# Patient Record
Sex: Male | Born: 1980 | Race: Black or African American | Hispanic: No | Marital: Single | Smoking: Current every day smoker
Health system: Southern US, Community
[De-identification: ages and names within clinical notes are randomized; demographics above are authoritative.]

## PROBLEM LIST (undated history)

## (undated) DIAGNOSIS — J45909 Unspecified asthma, uncomplicated: Secondary | ICD-10-CM

## (undated) DIAGNOSIS — Z9981 Dependence on supplemental oxygen: Secondary | ICD-10-CM

## (undated) DIAGNOSIS — J189 Pneumonia, unspecified organism: Secondary | ICD-10-CM

## (undated) DIAGNOSIS — R0602 Shortness of breath: Secondary | ICD-10-CM

## (undated) HISTORY — PX: NO PAST SURGERIES: SHX2092

---

## 2013-04-22 ENCOUNTER — Emergency Department (INDEPENDENT_AMBULATORY_CARE_PROVIDER_SITE_OTHER)
Admission: EM | Admit: 2013-04-22 | Discharge: 2013-04-22 | Disposition: A | Discharge status: Home / Self Care | Payer: Self-pay | Source: Home / Self Care | Attending: Family Medicine | Admitting: Family Medicine

## 2013-04-22 ENCOUNTER — Encounter (HOSPITAL_COMMUNITY): Payer: Self-pay

## 2013-04-22 DIAGNOSIS — L309 Dermatitis, unspecified: Secondary | ICD-10-CM

## 2013-04-22 DIAGNOSIS — L259 Unspecified contact dermatitis, unspecified cause: Secondary | ICD-10-CM

## 2013-04-22 MED ORDER — METHYLPREDNISOLONE ACETATE 80 MG/ML IJ SUSP
INTRAMUSCULAR | Status: AC
  Filled 2013-04-22: qty 1

## 2013-04-22 MED ORDER — METHYLPREDNISOLONE ACETATE 40 MG/ML IJ SUSP
80.0000 mg | Freq: Once | INTRAMUSCULAR | Status: AC
  Administered 2013-04-22: 80 mg via INTRAMUSCULAR

## 2013-04-22 MED ORDER — TRIAMCINOLONE ACETONIDE 40 MG/ML IJ SUSP
INTRAMUSCULAR | Status: AC
  Filled 2013-04-22: qty 5

## 2013-04-22 MED ORDER — TRIAMCINOLONE ACETONIDE 40 MG/ML IJ SUSP
40.0000 mg | Freq: Once | INTRAMUSCULAR | Status: AC
  Administered 2013-04-22: 40 mg via INTRAMUSCULAR

## 2013-04-22 MED ORDER — FLUTICASONE PROPIONATE 0.05 % EX CREA: TOPICAL_CREAM | Freq: Two times a day (BID) | CUTANEOUS | Status: DC

## 2013-04-22 NOTE — ED Provider Notes (Signed)
History     CSN: [id number]  Arrival date & time 04/22/13  1457   First MD Initiated Contact with Patient 04/22/13 1621      Chief Complaint  Patient presents with  . Rash    (Consider location/radiation/quality/duration/timing/severity/associated sxs/prior treatment) Patient is a 32 y.o. male presenting with rash. The history is provided by the patient and a friend.  Rash Duration:  4 weeks Progression:  Worsening Chronicity:  New Context comment:  No other person with rash, no h/o skin rashes. Ineffective treatments: alcohol not helping. Associated symptoms comment:  Itching   History reviewed. No pertinent past medical history.  History reviewed. No pertinent past surgical history.  History reviewed. No pertinent family history.  History  Substance Use Topics  . Smoking status: Not on file  . Smokeless tobacco: Not on file  . Alcohol Use: Not on file      Review of Systems  Constitutional: Negative.   Musculoskeletal: Negative.   Skin: Positive for rash.    Allergies  Review of patient's allergies indicates no known allergies.  Home Medications   Current Outpatient Rx  Name  Route  Sig  Dispense  Refill  . fluticasone (CUTIVATE) 0.05 % cream   Topical   Apply topically 2 (two) times daily.   60 g   0     BP 107/63  Pulse 60  Temp(Src) 98.2 F (36.8 C) (Oral)  Resp 18  SpO2 100%  Physical Exam  Nursing note and vitals reviewed. Constitutional: He is oriented to person, place, and time. He appears well-developed and well-nourished.  Neurological: He is alert and oriented to person, place, and time.  Skin: Skin is warm and dry. Rash noted.  Patchy irreg, papulovesicular rash on neck, right lower abd, not on arms or legs.    ED Course  Procedures (including critical care time)  Labs Reviewed - No data to display No results found.   1. Acute eczema       MDM         Linna Hoff, MD 04/22/13 2029

## 2013-04-22 NOTE — ED Notes (Signed)
Patient not in waiting room  

## 2013-04-22 NOTE — ED Notes (Signed)
Reports 1 month duration of rash , generalized, getting worse; NAD

## 2013-07-08 DIAGNOSIS — J189 Pneumonia, unspecified organism: Secondary | ICD-10-CM

## 2013-07-08 HISTORY — DX: Pneumonia, unspecified organism: J18.9

## 2013-07-21 ENCOUNTER — Emergency Department (HOSPITAL_COMMUNITY)
Admission: EM | Admit: 2013-07-21 | Discharge: 2013-07-22 | Disposition: A | Discharge status: Home / Self Care | Payer: Self-pay | Attending: Emergency Medicine | Admitting: Emergency Medicine

## 2013-07-21 ENCOUNTER — Encounter (HOSPITAL_COMMUNITY): Payer: Self-pay | Admitting: Emergency Medicine

## 2013-07-21 DIAGNOSIS — F17200 Nicotine dependence, unspecified, uncomplicated: Secondary | ICD-10-CM | POA: Insufficient documentation

## 2013-07-21 DIAGNOSIS — G8929 Other chronic pain: Secondary | ICD-10-CM | POA: Insufficient documentation

## 2013-07-21 DIAGNOSIS — J45909 Unspecified asthma, uncomplicated: Secondary | ICD-10-CM | POA: Insufficient documentation

## 2013-07-21 DIAGNOSIS — IMO0002 Reserved for concepts with insufficient information to code with codable children: Secondary | ICD-10-CM | POA: Insufficient documentation

## 2013-07-21 DIAGNOSIS — Z87828 Personal history of other (healed) physical injury and trauma: Secondary | ICD-10-CM | POA: Insufficient documentation

## 2013-07-21 DIAGNOSIS — M5430 Sciatica, unspecified side: Secondary | ICD-10-CM | POA: Insufficient documentation

## 2013-07-21 DIAGNOSIS — Z720 Tobacco use: Secondary | ICD-10-CM

## 2013-07-21 DIAGNOSIS — M5431 Sciatica, right side: Secondary | ICD-10-CM

## 2013-07-21 HISTORY — DX: Unspecified asthma, uncomplicated: J45.909
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
## 2013-07-22 MED ORDER — IBUPROFEN 600 MG PO TABS: 600.0000 mg | ORAL_TABLET | Freq: Four times a day (QID) | ORAL | Status: DC | PRN

## 2013-07-22 MED ORDER — TRAMADOL HCL 50 MG PO TABS: 50.0000 mg | ORAL_TABLET | Freq: Four times a day (QID) | ORAL | Status: DC | PRN

## 2013-07-22 MED ORDER — CYCLOBENZAPRINE HCL 10 MG PO TABS: 10.0000 mg | ORAL_TABLET | Freq: Two times a day (BID) | ORAL | Status: DC | PRN

## 2013-07-22 MED ORDER — OXYCODONE-ACETAMINOPHEN 5-325 MG PO TABS
2.0000 | ORAL_TABLET | Freq: Once | ORAL | Status: AC
  Administered 2013-07-22: 2 via ORAL
  Filled 2013-07-22: qty 2

## 2013-07-22 MED ORDER — IBUPROFEN 800 MG PO TABS
800.0000 mg | ORAL_TABLET | Freq: Once | ORAL | Status: AC
  Administered 2013-07-22: 800 mg via ORAL
  Filled 2013-07-22: qty 1
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
## 2013-07-25 ENCOUNTER — Encounter (HOSPITAL_COMMUNITY): Payer: Self-pay | Admitting: *Deleted

## 2013-07-25 ENCOUNTER — Inpatient Hospital Stay (HOSPITAL_COMMUNITY)
Admission: EM | Admit: 2013-07-25 | Discharge: 2013-07-31 | DRG: 193 | Disposition: A | Discharge status: Home / Self Care | Payer: Self-pay | Attending: Internal Medicine | Admitting: Internal Medicine

## 2013-07-25 ENCOUNTER — Emergency Department (HOSPITAL_COMMUNITY): Payer: Self-pay

## 2013-07-25 DIAGNOSIS — J441 Chronic obstructive pulmonary disease with (acute) exacerbation: Secondary | ICD-10-CM | POA: Diagnosis present

## 2013-07-25 DIAGNOSIS — F17200 Nicotine dependence, unspecified, uncomplicated: Secondary | ICD-10-CM | POA: Diagnosis present

## 2013-07-25 DIAGNOSIS — J189 Pneumonia, unspecified organism: Principal | ICD-10-CM | POA: Diagnosis present

## 2013-07-25 DIAGNOSIS — J9601 Acute respiratory failure with hypoxia: Secondary | ICD-10-CM | POA: Diagnosis present

## 2013-07-25 DIAGNOSIS — J9600 Acute respiratory failure, unspecified whether with hypoxia or hypercapnia: Secondary | ICD-10-CM | POA: Diagnosis present

## 2013-07-25 DIAGNOSIS — Z720 Tobacco use: Secondary | ICD-10-CM | POA: Diagnosis present

## 2013-07-25 DIAGNOSIS — M5430 Sciatica, unspecified side: Secondary | ICD-10-CM | POA: Diagnosis present

## 2013-07-25 DIAGNOSIS — J45901 Unspecified asthma with (acute) exacerbation: Secondary | ICD-10-CM | POA: Diagnosis present

## 2013-07-25 LAB — CBC WITH DIFFERENTIAL/PLATELET
Eosinophils Absolute: 0 10*3/uL (ref 0.0–0.7)
Eosinophils Relative: 0 % (ref 0–5)
Hemoglobin: 14.1 g/dL (ref 13.0–17.0)
Lymphocytes Relative: 6 % — ABNORMAL LOW (ref 12–46)
Lymphs Abs: 0.8 10*3/uL (ref 0.7–4.0)
MCH: 30.9 pg (ref 26.0–34.0)
MCV: 87.1 fL (ref 78.0–100.0)
Monocytes Relative: 5 % (ref 3–12)
RBC: 4.56 MIL/uL (ref 4.22–5.81)
WBC: 13.4 10*3/uL — ABNORMAL HIGH (ref 4.0–10.5)

## 2013-07-25 LAB — BASIC METABOLIC PANEL
BUN: 13 mg/dL (ref 6–23)
CO2: 25 mEq/L (ref 19–32)
GFR calc non Af Amer: >90 mL/min (ref >90)
Glucose, Bld: 145 mg/dL — ABNORMAL HIGH (ref 70–99)
Potassium: 4.3 mEq/L (ref 3.5–5.1)
Sodium: 138 mEq/L (ref 135–145)

## 2013-07-25 LAB — MRSA PCR SCREENING: MRSA by PCR: NEGATIVE

## 2013-07-25 MED ORDER — ENOXAPARIN SODIUM 40 MG/0.4ML ~~LOC~~ SOLN
40.0000 mg | SUBCUTANEOUS | Status: DC
  Administered 2013-07-25 – 2013-07-30 (×6): 40 mg via SUBCUTANEOUS
  Filled 2013-07-25 (×7): qty 0.4

## 2013-07-25 MED ORDER — PNEUMOCOCCAL VAC POLYVALENT 25 MCG/0.5ML IJ INJ
0.5000 mL | INJECTION | INTRAMUSCULAR | Status: AC
  Administered 2013-07-26: 0.5 mL via INTRAMUSCULAR
  Filled 2013-07-25: qty 0.5

## 2013-07-25 MED ORDER — ALBUTEROL SULFATE (5 MG/ML) 0.5% IN NEBU
2.5000 mg | INHALATION_SOLUTION | Freq: Four times a day (QID) | RESPIRATORY_TRACT | Status: DC
  Administered 2013-07-25 – 2013-07-26 (×3): 2.5 mg via RESPIRATORY_TRACT
  Filled 2013-07-25 (×3): qty 0.5

## 2013-07-25 MED ORDER — CEFTRIAXONE SODIUM 1 G IJ SOLR
1.0000 g | Freq: Once | INTRAMUSCULAR | Status: AC
  Administered 2013-07-25: 1 g via INTRAMUSCULAR
  Filled 2013-07-25: qty 10

## 2013-07-25 MED ORDER — ONDANSETRON HCL 4 MG/2ML IJ SOLN: 4.0000 mg | Freq: Three times a day (TID) | INTRAMUSCULAR | Status: DC | PRN

## 2013-07-25 MED ORDER — ALBUTEROL (5 MG/ML) CONTINUOUS INHALATION SOLN
10.0000 mg/h | INHALATION_SOLUTION | Freq: Once | RESPIRATORY_TRACT | Status: AC
Start: 2013-07-25 — End: 2013-07-25

## 2013-07-25 MED ORDER — ALBUTEROL SULFATE (5 MG/ML) 0.5% IN NEBU: 2.5000 mg | INHALATION_SOLUTION | RESPIRATORY_TRACT | Status: DC

## 2013-07-25 MED ORDER — DEXTROSE 5 % IV SOLN
500.0000 mg | INTRAVENOUS | Status: DC
  Filled 2013-07-25: qty 500

## 2013-07-25 MED ORDER — PANTOPRAZOLE SODIUM 40 MG PO TBEC
40.0000 mg | DELAYED_RELEASE_TABLET | Freq: Every day | ORAL | Status: DC
  Administered 2013-07-26 – 2013-07-28 (×3): 40 mg via ORAL
  Filled 2013-07-25 (×4): qty 1

## 2013-07-25 MED ORDER — DEXTROSE 5 % IV SOLN
1.0000 g | INTRAVENOUS | Status: DC
  Administered 2013-07-26 – 2013-07-27 (×2): 1 g via INTRAVENOUS
  Filled 2013-07-25 (×3): qty 10

## 2013-07-25 MED ORDER — ACETAMINOPHEN 325 MG PO TABS
650.0000 mg | ORAL_TABLET | Freq: Four times a day (QID) | ORAL | Status: DC | PRN
  Administered 2013-07-29: 650 mg via ORAL
  Filled 2013-07-25: qty 2

## 2013-07-25 MED ORDER — ALBUTEROL SULFATE (5 MG/ML) 0.5% IN NEBU
2.5000 mg | INHALATION_SOLUTION | RESPIRATORY_TRACT | Status: DC | PRN
  Administered 2013-07-27 – 2013-07-29 (×3): 2.5 mg via RESPIRATORY_TRACT
  Filled 2013-07-25 (×4): qty 0.5

## 2013-07-25 MED ORDER — LIDOCAINE HCL (PF) 1 % IJ SOLN
INTRAMUSCULAR | Status: AC
  Administered 2013-07-25: 2 mL
  Filled 2013-07-25: qty 5

## 2013-07-25 MED ORDER — ONDANSETRON HCL 4 MG/2ML IJ SOLN: 4.0000 mg | Freq: Four times a day (QID) | INTRAMUSCULAR | Status: DC | PRN

## 2013-07-25 MED ORDER — SODIUM CHLORIDE 0.9 % IV SOLN
INTRAVENOUS | Status: DC
  Administered 2013-07-25 – 2013-07-28 (×5): via INTRAVENOUS

## 2013-07-25 MED ORDER — ALBUTEROL (5 MG/ML) CONTINUOUS INHALATION SOLN
INHALATION_SOLUTION | RESPIRATORY_TRACT | Status: AC
  Administered 2013-07-25: 10 mg/h via RESPIRATORY_TRACT
  Filled 2013-07-25: qty 20

## 2013-07-25 MED ORDER — TRAMADOL HCL 50 MG PO TABS
50.0000 mg | ORAL_TABLET | Freq: Four times a day (QID) | ORAL | Status: DC | PRN
  Administered 2013-07-25 – 2013-07-30 (×6): 50 mg via ORAL
  Filled 2013-07-25 (×6): qty 1

## 2013-07-25 MED ORDER — METHYLPREDNISOLONE SODIUM SUCC 125 MG IJ SOLR
60.0000 mg | Freq: Three times a day (TID) | INTRAMUSCULAR | Status: DC
  Administered 2013-07-25 – 2013-07-28 (×7): 60 mg via INTRAVENOUS
  Filled 2013-07-25 (×11): qty 0.96

## 2013-07-25 MED ORDER — INFLUENZA VAC SPLIT QUAD 0.5 ML IM SUSP
0.5000 mL | INTRAMUSCULAR | Status: AC
  Administered 2013-07-26: 0.5 mL via INTRAMUSCULAR
  Filled 2013-07-25: qty 0.5

## 2013-07-25 MED ORDER — DEXAMETHASONE SODIUM PHOSPHATE 10 MG/ML IJ SOLN
10.0000 mg | Freq: Once | INTRAMUSCULAR | Status: AC
  Administered 2013-07-25: 10 mg via INTRAVENOUS
  Filled 2013-07-25: qty 1

## 2013-07-25 MED ORDER — DEXTROSE 5 % IV SOLN
500.0000 mg | Freq: Once | INTRAVENOUS | Status: DC
  Administered 2013-07-25: 500 mg via INTRAVENOUS
  Filled 2013-07-25: qty 500

## 2013-07-25 MED ORDER — ALUM & MAG HYDROXIDE-SIMETH 200-200-20 MG/5ML PO SUSP: 15.0000 mL | ORAL | Status: DC | PRN

## 2013-07-25 MED ORDER — CYCLOBENZAPRINE HCL 10 MG PO TABS
10.0000 mg | ORAL_TABLET | Freq: Two times a day (BID) | ORAL | Status: DC | PRN
  Administered 2013-07-26 – 2013-07-28 (×3): 10 mg via ORAL
  Filled 2013-07-25 (×3): qty 1

## 2013-07-25 MED ORDER — SODIUM CHLORIDE 0.9 % IV BOLUS (SEPSIS)
1000.0000 mL | Freq: Once | INTRAVENOUS | Status: AC
  Administered 2013-07-25: 1000 mL via INTRAVENOUS

## 2013-07-25 MED ORDER — IPRATROPIUM BROMIDE 0.02 % IN SOLN
0.5000 mg | Freq: Four times a day (QID) | RESPIRATORY_TRACT | Status: DC
  Administered 2013-07-25 – 2013-07-26 (×3): 0.5 mg via RESPIRATORY_TRACT
  Filled 2013-07-25 (×3): qty 2.5
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
## 2013-07-25 NOTE — ED Notes (Signed)
MD aware of patient O2 level on 2L via [place] of 88%, O2 increased to 4L, will continue to monitor
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
## 2013-07-25 NOTE — ED Notes (Signed)
Pt reports having asthma, increase in sob yesterday, no relief with inhalers. spo2 90%, HR 130 at triage.

## 2013-07-25 NOTE — ED Provider Notes (Signed)
CSN: [id number]     Arrival date & time 07/25/13  1623 History   None    Chief Complaint  Patient presents with  . Asthma  . Shortness of Breath   (Consider location/radiation/quality/duration/timing/severity/associated sxs/prior Treatment) Patient is a 32 y.o. male presenting with shortness of breath. The history is provided by the patient. No language interpreter was used.  Shortness of Breath Severity:  Severe Onset quality:  Sudden Duration:  1 day Timing:  Constant Progression:  Worsening Chronicity:  New Context: URI   Relieved by:  Nothing Worsened by:  Exertion and activity Ineffective treatments:  Inhaler Associated symptoms: cough, fever (subjective) and sore throat   Associated symptoms: no abdominal pain, no chest pain, no headaches, no rash and no vomiting   Risk factors comment:  Asthma   Past Medical History  Diagnosis Date  . Asthma    History reviewed. No pertinent past surgical history. History reviewed. No pertinent family history. History  Substance Use Topics  . Smoking status: Never Smoker   . Smokeless tobacco: Not on file  . Alcohol Use: Yes     Comment: occ    Review of Systems  Constitutional: Positive for fever (subjective).  HENT: Positive for congestion, sore throat and rhinorrhea.   Respiratory: Positive for cough and shortness of breath.   Cardiovascular: Negative for chest pain.  Gastrointestinal: Negative for nausea, vomiting, abdominal pain and diarrhea.  Genitourinary: Negative for dysuria and hematuria.  Skin: Negative for rash.  Neurological: Negative for syncope, light-headedness and headaches.  All other systems reviewed and are negative.    Allergies  Review of patient's allergies indicates no known allergies.  Home Medications   Current Outpatient Rx  Name  Route  Sig  Dispense  Refill  . albuterol (PROVENTIL HFA;VENTOLIN HFA) 108 (90 BASE) MCG/ACT inhaler   Inhalation   Inhale 2 puffs into the lungs every 6 (six)  hours as needed for wheezing.         . cyclobenzaprine (FLEXERIL) 10 MG tablet   Oral   Take 1 tablet (10 mg total) by mouth 2 (two) times daily as needed for muscle spasms.   20 tablet   0   . ibuprofen (ADVIL,MOTRIN) 600 MG tablet   Oral   Take 1 tablet (600 mg total) by mouth every 6 (six) hours as needed for pain.   30 tablet   0   . traMADol (ULTRAM) 50 MG tablet   Oral   Take 1 tablet (50 mg total) by mouth every 6 (six) hours as needed for pain.   15 tablet   0    BP 106/76  Pulse 134  Temp(Src) 98.9 F (37.2 C) (Oral)  Resp 26  SpO2 92% Physical Exam  Nursing note and vitals reviewed. Constitutional: He is oriented to person, place, and time. He appears well-developed and well-nourished. He appears distressed (mild).  HENT:  Head: Normocephalic and atraumatic.  Right Ear: External ear normal.  Left Ear: External ear normal.  Eyes: EOM are normal.  Neck: Normal range of motion. Neck supple.  Cardiovascular: Normal rate, regular rhythm, normal heart sounds and intact distal pulses.  Exam reveals no gallop and no friction rub.   No murmur heard. Pulmonary/Chest: No respiratory distress. He has wheezes. He has no rales. He exhibits no tenderness.  tachypnea  Abdominal: Soft. Bowel sounds are normal. He exhibits no distension. There is no tenderness. There is no rebound.  Musculoskeletal: Normal range of motion. He exhibits no edema and  no tenderness.  Lymphadenopathy:    He has no cervical adenopathy.  Neurological: He is alert and oriented to person, place, and time.  Skin: Skin is warm. No rash noted.  Psychiatric: He has a normal mood and affect. His behavior is normal.    ED Course  Procedures (including critical care time) Labs Review Labs Reviewed  CBC WITH DIFFERENTIAL - Abnormal; Notable for the following:    WBC 13.4 (*)    Neutrophils Relative % 89 (*)    Neutro Abs 12.0 (*)    Lymphocytes Relative 6 (*)    All other components within normal  limits  BASIC METABOLIC PANEL - Abnormal; Notable for the following:    Glucose, Bld 145 (*)    All other components within normal limits  MRSA PCR SCREENING  CULTURE, BLOOD (ROUTINE X 2)  CULTURE, BLOOD (ROUTINE X 2)  CULTURE, EXPECTORATED SPUTUM-ASSESSMENT  GRAM STAIN  LEGIONELLA ANTIGEN, URINE  STREP PNEUMONIAE URINARY ANTIGEN  HIV ANTIBODY (ROUTINE TESTING)   Imaging Review Dg Chest Portable 1 View  07/25/2013   CLINICAL DATA:  Chest pain and shortness of breast.  EXAM: PORTABLE CHEST - 1 VIEW  COMPARISON:  None.  FINDINGS: Single view of the chest was obtained. There are patchy densities in the mid and lower lungs bilaterally. There is a more confluent opacity at the left lung base. Heart and mediastinum are within normal limits. The trachea is midline.  IMPRESSION: Patchy densities in the lower lungs, left side greater than right. Findings are concerning for multi focal pneumonia.   Electronically Signed   By: Richarda Overlie M.D.   On: 07/25/2013 18:04    MDM   1. Acute respiratory failure   2. Asthma exacerbation   3. PNA (pneumonia)   4. Tobacco abuse    4:34 PM Pt is a 32 y.o. male with pertinent PMHX of asthma who presents to the ED with shortness of breath. Pt presented with shortness of breath since yesterday. Endorses subjective fevers, cough, congestion, runny nose. No sick contacts. Denies chest pain. Worse with exertion, relieved by nothing. No relief with inhalers. Has been out of inhalers for several years. Uses inhaler daily. Has been using his girlfriend's inhaler. Not on steroids. Continues to smoke  History of asthma: history of need for ICU admission, no history of need for intubation for asthma.  On exam: AF, tachypnea, mild distress. Wheezes. Plan for continuous albuterol, decadron IV, CXR given history of URI, chronic lung disease, history of smoking to rule out pnemonia  CXR PA/LAT for shortness of breath showed concern for pneumonia in the lower lungs  left>right  Review of labs: BMP showed no electrolyte abnormalities. CBC showed leukocytosis, H&H 14.1/39.7.   Plan to start Azithromycin and Rocephin for pneumonia and consult hospitalist for admission Will also obtain blood cultures  The patient appears reasonably stabilized for admission considering the current resources, flow, and capabilities available in the ED at this time, and I doubt any other [facility] requiring further screening and/or treatment in the ED prior to admission.   Plan for admission to hospitalist for further evaluation and management. PT transferred to step down VSS.  Labs and imaging reviewed by myself and considered in medical decision making if ordered.  Imaging interpreted by radiology. Pt was discussed with my attending, Dr. Kavin Leech, MD 07/26/13 [phone]

## 2013-07-26 LAB — STREP PNEUMONIAE URINARY ANTIGEN: Strep Pneumo Urinary Antigen: NEGATIVE

## 2013-07-26 LAB — EXPECTORATED SPUTUM ASSESSMENT W GRAM STAIN, RFLX TO RESP C

## 2013-07-26 LAB — HIV ANTIBODY (ROUTINE TESTING W REFLEX): HIV: NONREACTIVE

## 2013-07-26 MED ORDER — BIOTENE DRY MOUTH MT LIQD
15.0000 mL | Freq: Two times a day (BID) | OROMUCOSAL | Status: DC
  Administered 2013-07-26 – 2013-07-28 (×6): 15 mL via OROMUCOSAL

## 2013-07-26 MED ORDER — AZITHROMYCIN 500 MG PO TABS
500.0000 mg | ORAL_TABLET | Freq: Every day | ORAL | Status: DC
  Administered 2013-07-26 – 2013-07-27 (×2): 500 mg via ORAL
  Filled 2013-07-26 (×3): qty 1

## 2013-07-26 MED ORDER — ALBUTEROL SULFATE (5 MG/ML) 0.5% IN NEBU
2.5000 mg | INHALATION_SOLUTION | Freq: Four times a day (QID) | RESPIRATORY_TRACT | Status: DC
  Administered 2013-07-26 – 2013-07-29 (×12): 2.5 mg via RESPIRATORY_TRACT
  Filled 2013-07-26 (×11): qty 0.5

## 2013-07-26 MED ORDER — IPRATROPIUM BROMIDE 0.02 % IN SOLN
0.5000 mg | Freq: Four times a day (QID) | RESPIRATORY_TRACT | Status: DC
  Administered 2013-07-26 – 2013-07-29 (×12): 0.5 mg via RESPIRATORY_TRACT
  Filled 2013-07-26 (×12): qty 2.5

## 2013-07-26 MED ORDER — NICOTINE 21 MG/24HR TD PT24
21.0000 mg | MEDICATED_PATCH | Freq: Every day | TRANSDERMAL | Status: DC
  Administered 2013-07-26 – 2013-07-27 (×2): 21 mg via TRANSDERMAL
  Filled 2013-07-26 (×2): qty 1

## 2013-07-26 NOTE — Care Management Note (Addendum)
    Page 1 of 1   07/29/2013     10:27:39 AM   CARE MANAGEMENT NOTE 07/29/2013  Patient:  Jacob Vaughan, Jacob Vaughan   Account Number:  [id number]  Date Initiated:  07/26/2013  Documentation initiated by:  MAYO,HENRIETTA  Subjective/Objective Assessment:   adm with dx of resp failure; lives with SO     Action/Plan:   Anticipated DC Date:     Anticipated DC Plan:        DC Planning Services  CM consult  [facility]  [facility] Program      Choice offered to / List presented to:             Status of service:   Medicare Important Message given?   (If response is "NO", the following Medicare IM given date fields will be blank) Date Medicare IM given:   Date Additional Medicare IM given:    Discharge Disposition:    Per UR Regulation:  Reviewed for med. necessity/level of care/duration of stay  If discussed at Long Length of Stay Meetings, dates discussed:    Comments:  Contact: Joyce [place], [place]  #[phone]  07-29-13 Provided patient with [facility] letter . Explianed pain medication not covered and co pay of $3 / prescription , dates 07-29-13 to 08-04-13 . Marland Kitchen Patient voiced understanding and that he can afford co pay . Ronny Flurry RN BSN [phone]  07/26/13 1432 Henrietta Mayo RN MSN BSN CCM Provided pt with contact information for insurance enrollment and informed him that he qualifies for [facility] program for 34 day supply of meds upon discharge for $3 each script.  Pt requests assistance with appt with [facility].  Contacted clinic - pt has appt for Tuesday, September 30th @ 12:00 p.m.  Appt and contact information given to pt.

## 2013-07-26 NOTE — Progress Notes (Signed)
Utilization Review Completed.  

## 2013-07-26 NOTE — ED Provider Notes (Signed)
I saw and evaluated the patient, reviewed the resident's note and I agree with the findings and plan.   Patient with acute resp distress from asthma, improved with steroids and continuous neb. Xray c/w multifocal PNA, will treat as CAP. Patient improved with albuterol and able to be weaned to [facility]. Stable for stepdown.   Audree Camel, MD 07/26/13 1249

## 2013-07-26 NOTE — Progress Notes (Signed)
[facility] Progress Note [place] TEAM 1 - Stepdown/ICU TEAM   Jacob Vaughan ZOX:[id number] DOB: 04-Feb-1981 DOA: 07/25/2013 PCP: No PCP Per Patient  Admit HPI / Brief Narrative: 32 y.o. 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 The patient had been having shortness of breath and cough off and on for over a month. Cough was mostly dry and at times productive. His shortness of breath had been getting progressively worse - he had been using his inhalers very frequently without much relief. As a result he presented to the emergency room where he was found to have pneumonia.   Assessment/Plan:  Acute hypoxic respiratory failure Due to pneumonia plus asthma - improving  Bilateral lower lobe community acquired pneumonia Continue empiric antibiotic therapy  Acute asthma exacerbation Continues to wheeze - continue current interventions  Chronic back pain with sciatica of right lower extremity Continue current pain medication regimen  Ongoing tobacco abuse I had an extensive discussion with the patient and his girlfriend during which I explained in no uncertain terms the damage associated with ongoing tobacco abuse in the absolute need to discontinue smoking immediately  Code Status: FULL Family Communication: Discussed treatment plan with patient and girlfriend at bedside Disposition Plan: SDU  Consultants: None  Procedures: None  Antibiotics: Rocephin 9/18 >> Azithromycin 9/18 >>  DVT prophylaxis: lovenox  HPI/Subjective: The patient continues to complain of shortness of breath.  He is having coughing paroxysms during which he has severe difficulty breathing.  He complains of ongoing low back pain.  He denies nausea vomiting or abdominal pain.  Objective: Blood pressure 107/65, pulse 89, temperature 97.6 F (36.4 C), temperature source Oral, resp.  rate 25, height 6' (1.829 m), weight 83.8 kg (184 lb 11.9 oz), SpO2 94.00%.  Intake/Output Summary (Last 24 hours) at 07/26/13 1048 Last data filed at 07/26/13 8119  Gross per 24 hour  Intake   1880 ml  Output    785 ml  Net   1095 ml   Exam: General: No acute respiratory distress Lungs: Diffuse expiratory wheeze with bibasilar crackles Cardiovascular: Regular rate and rhythm without murmur gallop or rub normal S1 and S2 Abdomen: Nontender, nondistended, soft, bowel sounds positive, no rebound, no ascites, no appreciable mass Extremities: No significant cyanosis, clubbing, or edema bilateral lower extremities  Data Reviewed: Basic Metabolic Panel:  Recent Labs Lab 07/25/13 1937  NA 138  K 4.3  CL 103  CO2 25  GLUCOSE 145*  BUN 13  CREATININE 0.96  CALCIUM 8.4   CBC:  Recent Labs Lab 07/25/13 1937  WBC 13.4*  NEUTROABS 12.0*  HGB 14.1  HCT 39.7  MCV 87.1  PLT 225    Recent Results (from the past 240 hour(s))  MRSA PCR SCREENING     Status: None   Collection Time    07/25/13  8:16 PM      Result Value Range Status   MRSA by PCR NEGATIVE  NEGATIVE Final   Comment:            The GeneXpert MRSA Assay (FDA     approved for NASAL specimens     only), is one component of a     comprehensive MRSA colonization     surveillance program. It is not     intended to diagnose MRSA     infection nor to guide or     monitor treatment for  MRSA infections.  CULTURE, EXPECTORATED SPUTUM-ASSESSMENT     Status: None   Collection Time    07/26/13  2:37 AM      Result Value Range Status   Specimen Description SPUTUM   Final   Special Requests NONE   Final   Sputum evaluation     Final   Value: THIS SPECIMEN IS ACCEPTABLE. RESPIRATORY CULTURE REPORT TO FOLLOW.   Report Status 07/26/2013 FINAL   Final     Studies:  Recent x-ray studies have been reviewed in detail by the Attending Physician  Scheduled Meds:  Scheduled Meds: . albuterol  2.5 mg Nebulization Q6H   . antiseptic oral rinse  15 mL Mouth Rinse BID  . azithromycin  500 mg Intravenous Q24H  . cefTRIAXone (ROCEPHIN)  IV  1 g Intravenous Q24H  . enoxaparin (LOVENOX) injection  40 mg Subcutaneous Q24H  . ipratropium  0.5 mg Nebulization Q6H  . methylPREDNISolone (SOLU-MEDROL) injection  60 mg Intravenous Q8H  . pantoprazole  40 mg Oral Q1200    Time spent on care of this patient: 35 mins   [facility] T  [facility] Office  [phone] Pager - Text Page per Loretha Stapler as per below:  On-Call/Text Page:      [email or web address]      password [email or web address]  If 7PM-7AM, please contact night-coverage [email or web address] Password [email or web address] 07/26/2013, 10:48 AM   LOS: 1 day

## 2013-07-27 MED ORDER — NICOTINE 14 MG/24HR TD PT24
14.0000 mg | MEDICATED_PATCH | Freq: Every day | TRANSDERMAL | Status: DC
  Administered 2013-07-28 – 2013-07-31 (×4): 14 mg via TRANSDERMAL
  Filled 2013-07-27 (×4): qty 1

## 2013-07-27 NOTE — Progress Notes (Signed)
[facility] Progress Note [place] TEAM 1 - Stepdown/ICU TEAM   [name] NWG:[id number] DOB: 1981-05-21 DOA: 07/25/2013 PCP: No PCP Per Patient  Admit HPI / Brief Narrative: 32 y.o. 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 The patient had been having shortness of breath and cough off and on for over a month. Cough was mostly dry and at times productive. His shortness of breath had been getting progressively worse - he had been using his inhalers very frequently without much relief. As a result he presented to the emergency room where he was found to have pneumonia.   Assessment/Plan:  Acute hypoxic respiratory failure Due to pneumonia plus asthma - improving - attempt to begin to wean O2  Bilateral lower lobe community acquired pneumonia Continue empiric antibiotic therapy - f/u CXR in AM to assure not progressing     Acute asthma exacerbation Continues to wheeze - continue current interventions without change today   Chronic back pain with sciatica of right lower extremity Continue current pain medication regimen  Ongoing tobacco abuse I had an extensive discussion with the patient and his girlfriend during which I explained in no uncertain terms the damage associated with ongoing tobacco abuse in the absolute need to discontinue smoking immediately  Code Status: FULL Family Communication: Discussed treatment plan with patient and girlfriend at bedside Disposition Plan: transfer to med bed - attempt to wean O2 - follow exam for improvement in wheeze   Consultants: None  Procedures: None  Antibiotics: Rocephin 9/18 >> Azithromycin 9/18 >>  DVT prophylaxis: lovenox  HPI/Subjective: The patient feels that his shortness of breath is not quite as severe today.  He continues to experience wheezing.  He denies chest pain nausea vomiting abdominal  pain or headache.  We had a discussion concerning the use of nicotine patches.  Objective: Blood pressure 122/76, pulse 101, temperature 97.7 F (36.5 C), temperature source Oral, resp. rate 23, height 6' (1.829 m), weight 86.5 kg (190 lb 11.2 oz), SpO2 91.00%.  Intake/Output Summary (Last 24 hours) at 07/27/13 1023 Last data filed at 07/27/13 0800  Gross per 24 hour  Intake 2167.92 ml  Output   2650 ml  Net -482.08 ml   Exam: General: No acute respiratory distress Lungs: Diffuse expiratory wheeze with bibasilar crackles - only minimally improved compared to yesterday Cardiovascular: Tachycardic at 110 beats per minute but regular without appreciable murmur gallop or rub Abdomen: Nontender, nondistended, soft, bowel sounds positive, no rebound, no ascites, no appreciable mass Extremities: No significant cyanosis, clubbing, or edema bilateral lower extremities  Data Reviewed: Basic Metabolic Panel:  Recent Labs Lab 07/25/13 1937  NA 138  K 4.3  CL 103  CO2 25  GLUCOSE 145*  BUN 13  CREATININE 0.96  CALCIUM 8.4   CBC:  Recent Labs Lab 07/25/13 1937  WBC 13.4*  NEUTROABS 12.0*  HGB 14.1  HCT 39.7  MCV 87.1  PLT 225    Recent Results (from the past 240 hour(s))  MRSA PCR SCREENING     Status: None   Collection Time    07/25/13  8:16 PM      Result Value Range Status   MRSA by PCR NEGATIVE  NEGATIVE Final   Comment:            The GeneXpert MRSA Assay (FDA     approved for NASAL specimens  only), is one component of a     comprehensive MRSA colonization     surveillance program. It is not     intended to diagnose MRSA     infection nor to guide or     monitor treatment for     MRSA infections.  CULTURE, EXPECTORATED SPUTUM-ASSESSMENT     Status: None   Collection Time    07/26/13  2:37 AM      Result Value Range Status   Specimen Description SPUTUM   Final   Special Requests NONE   Final   Sputum evaluation     Final   Value: THIS SPECIMEN IS  ACCEPTABLE. RESPIRATORY CULTURE REPORT TO FOLLOW.   Report Status 07/26/2013 FINAL   Final  CULTURE, RESPIRATORY (NON-EXPECTORATED)     Status: None   Collection Time    07/26/13  2:37 AM      Result Value Range Status   Specimen Description SPUTUM   Final   Special Requests NONE   Final   Gram Stain     Final   Value: MODERATE WBC PRESENT,BOTH PMN AND MONONUCLEAR     RARE SQUAMOUS EPITHELIAL CELLS PRESENT     FEW GRAM POSITIVE COCCI     IN PAIRS RARE GRAM NEGATIVE RODS     RARE GRAM POSITIVE RODS   Culture PENDING   Incomplete   Report Status PENDING   Incomplete     Studies:  Recent x-ray studies have been reviewed in detail by the Attending Physician  Scheduled Meds:  Scheduled Meds: . albuterol  2.5 mg Nebulization QID  . antiseptic oral rinse  15 mL Mouth Rinse BID  . azithromycin  500 mg Oral QHS  . cefTRIAXone (ROCEPHIN)  IV  1 g Intravenous Q24H  . enoxaparin (LOVENOX) injection  40 mg Subcutaneous Q24H  . ipratropium  0.5 mg Nebulization QID  . methylPREDNISolone (SOLU-MEDROL) injection  60 mg Intravenous Q8H  . nicotine  21 mg Transdermal Daily  . pantoprazole  40 mg Oral Q1200    Time spent on care of this patient: 25 mins   [facility] T  [facility] Office  [phone] Pager - Text Page per Loretha Stapler as per below:  On-Call/Text Page:      [email or web address]      password [email or web address]  If 7PM-7AM, please contact night-coverage [email or web address] Password [email or web address] 07/27/2013, 10:23 AM   LOS: 2 days

## 2013-07-28 ENCOUNTER — Inpatient Hospital Stay (HOSPITAL_COMMUNITY): Payer: MEDICAID

## 2013-07-28 LAB — CULTURE, RESPIRATORY W GRAM STAIN: Culture: NORMAL

## 2013-07-28 MED ORDER — LORATADINE 10 MG PO TABS
10.0000 mg | ORAL_TABLET | Freq: Every day | ORAL | Status: DC
  Administered 2013-07-28 – 2013-07-31 (×4): 10 mg via ORAL
  Filled 2013-07-28 (×4): qty 1

## 2013-07-28 MED ORDER — METHYLPREDNISOLONE SODIUM SUCC 40 MG IJ SOLR
40.0000 mg | Freq: Two times a day (BID) | INTRAMUSCULAR | Status: DC
  Administered 2013-07-28 – 2013-07-29 (×2): 40 mg via INTRAVENOUS
  Filled 2013-07-28 (×6): qty 1

## 2013-07-28 MED ORDER — CEFUROXIME AXETIL 500 MG PO TABS
500.0000 mg | ORAL_TABLET | Freq: Two times a day (BID) | ORAL | Status: DC
  Administered 2013-07-28 – 2013-07-31 (×6): 500 mg via ORAL
  Filled 2013-07-28 (×8): qty 1

## 2013-07-28 NOTE — Progress Notes (Signed)
[facility] Progress Note Jacob Vaughan TEAM 1 - Stepdown/ICU TEAM   Jacob Vaughan ZOX:[id number] DOB: [birth year] DOA: 07/25/2013 PCP: No PCP Per Patient  Admit HPI / Brief Narrative: 32 y.o. 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 The patient had been having shortness of breath and cough off and on for over a month. Cough was mostly dry and at times productive. His shortness of breath had been getting progressively worse - he had been using his inhalers very frequently without much relief. As a result he presented to the emergency room where he was found to have pneumonia.   Assessment/Plan:  Acute hypoxic respiratory failure Due to pneumonia plus asthma - improving - wean O2  Bilateral lower lobe community acquired pneumonia Continue empiric antibiotic therapy - f/u CXR suggests gradual improvement - will need re-eval with CXR until clar     Acute asthma exacerbation Wheezing slowly improving - begin to taper therapy  Chronic back pain with sciatica of right lower extremity Continue current pain medication regimen - begin PT/OT  Ongoing tobacco abuse I had an extensive discussion with the patient and his girlfriend during which I explained in no uncertain terms the damage associated with ongoing tobacco abuse in the absolute need to discontinue smoking immediately - the patient is responding favorably to nicotine patch  Code Status: FULL Family Communication: Discussed treatment plan again with patient and girlfriend at bedside Disposition Plan: transfer to med bed ordered again - continue to wean O2 - follow exam for improvement in wheeze   Consultants: None  Procedures: None  Antibiotics: Rocephin 9/18 >>9/21 Azithromycin 9/18 >>9/21 Ceftin 9/21 >>   DVT prophylaxis: lovenox  HPI/Subjective: The patient feels that he continues to slowly improve.   He has begun to expectorated a large amount of tan-colored phlegm.  He has not yet ambulated significantly.  His oxygen saturations remained marginal on supplemental oxygen.  Objective: Blood pressure 124/73, pulse 99, temperature 98 F (36.7 C), temperature source Axillary, resp. rate 20, height 6' (1.829 m), weight 86.5 kg (190 lb 11.2 oz), SpO2 97.00%.  Intake/Output Summary (Last 24 hours) at 07/28/13 0900 Last data filed at 07/28/13 0800  Gross per 24 hour  Intake 1389.17 ml  Output   1925 ml  Net -535.83 ml   Exam: General: No acute respiratory distress Lungs: Diffuse expiratory wheeze with bibasilar crackles - slightly improved compared to yesterday Cardiovascular: Regular rate and rhythm without appreciable murmur gallop or rub Abdomen: Nontender, nondistended, soft, bowel sounds positive, no rebound, no ascites, no appreciable mass Extremities: No significant cyanosis, clubbing, or edema bilateral lower extremities  Data Reviewed: Basic Metabolic Panel:  Recent Labs Lab 07/25/13 1937  NA 138  K 4.3  CL 103  CO2 25  GLUCOSE 145*  BUN 13  CREATININE 0.96  CALCIUM 8.4   CBC:  Recent Labs Lab 07/25/13 1937  WBC 13.4*  NEUTROABS 12.0*  HGB 14.1  HCT 39.7  MCV 87.1  PLT 225    Recent Results (from the past 240 hour(s))  CULTURE, BLOOD (ROUTINE X 2)     Status: None   Collection Time    07/25/13  7:35 PM      Result Value Range Status   Specimen Description BLOOD ARM RIGHT   Final   Special Requests BOTTLES DRAWN AEROBIC ONLY 10CC   Final   Culture  Setup Time  Final   Value: 07/26/2013 00:58     Performed at [facility]   Culture     Final   Value:        BLOOD CULTURE RECEIVED NO GROWTH TO DATE CULTURE WILL BE HELD FOR 5 DAYS BEFORE ISSUING A FINAL NEGATIVE REPORT     Performed at [facility]   Report Status PENDING   Incomplete  CULTURE, BLOOD (ROUTINE X 2)     Status: None   Collection Time    07/25/13  7:41 PM      Result  Value Range Status   Specimen Description BLOOD HAND RIGHT   Final   Special Requests BOTTLES DRAWN AEROBIC ONLY 2.5CC   Final   Culture  Setup Time     Final   Value: 07/26/2013 00:57     Performed at [facility]   Culture     Final   Value:        BLOOD CULTURE RECEIVED NO GROWTH TO DATE CULTURE WILL BE HELD FOR 5 DAYS BEFORE ISSUING A FINAL NEGATIVE REPORT     Performed at [facility]   Report Status PENDING   Incomplete  MRSA PCR SCREENING     Status: None   Collection Time    07/25/13  8:16 PM      Result Value Range Status   MRSA by PCR NEGATIVE  NEGATIVE Final   Comment:            The GeneXpert MRSA Assay (FDA     approved for NASAL specimens     only), is one component of a     comprehensive MRSA colonization     surveillance program. It is not     intended to diagnose MRSA     infection nor to guide or     monitor treatment for     MRSA infections.  CULTURE, EXPECTORATED SPUTUM-ASSESSMENT     Status: None   Collection Time    07/26/13  2:37 AM      Result Value Range Status   Specimen Description SPUTUM   Final   Special Requests NONE   Final   Sputum evaluation     Final   Value: THIS SPECIMEN IS ACCEPTABLE. RESPIRATORY CULTURE REPORT TO FOLLOW.   Report Status 07/26/2013 FINAL   Final  CULTURE, RESPIRATORY (NON-EXPECTORATED)     Status: None   Collection Time    07/26/13  2:37 AM      Result Value Range Status   Specimen Description SPUTUM   Final   Special Requests NONE   Final   Gram Stain     Final   Value: MODERATE WBC PRESENT,BOTH PMN AND MONONUCLEAR     RARE SQUAMOUS EPITHELIAL CELLS PRESENT     FEW GRAM POSITIVE COCCI     IN PAIRS RARE GRAM NEGATIVE RODS     RARE GRAM POSITIVE RODS   Culture     Final   Value: Culture reincubated for better growth     Performed at [facility]   Report Status PENDING   Incomplete     Studies:  Recent x-ray studies have been reviewed in detail by the Attending Physician  Scheduled  Meds:  Scheduled Meds: . albuterol  2.5 mg Nebulization QID  . antiseptic oral rinse  15 mL Mouth Rinse BID  . azithromycin  500 mg Oral QHS  . cefTRIAXone (ROCEPHIN)  IV  1 g Intravenous Q24H  . enoxaparin (  LOVENOX) injection  40 mg Subcutaneous Q24H  . ipratropium  0.5 mg Nebulization QID  . methylPREDNISolone (SOLU-MEDROL) injection  60 mg Intravenous Q8H  . nicotine  14 mg Transdermal Daily  . pantoprazole  40 mg Oral Q1200    Time spent on care of this patient: 25 mins   [facility] T  [facility] Office  [phone] Pager - Text Page per Loretha Stapler as per below:  On-Call/Text Page:      [email or web address]      password [email or web address]  If 7PM-7AM, please contact night-coverage [email or web address] Password [email or web address] 07/28/2013, 9:00 AM   LOS: 3 days

## 2013-07-29 LAB — BLOOD GAS, ARTERIAL
Acid-Base Excess: 7.1 mmol/L — ABNORMAL HIGH (ref 0.0–2.0)
Bicarbonate: 31.5 mEq/L — ABNORMAL HIGH (ref 20.0–24.0)
Patient temperature: 98.6
TCO2: 33 mmol/L (ref 0–100)
pCO2 arterial: 47.9 mmHg — ABNORMAL HIGH (ref 35.0–45.0)
pH, Arterial: 7.433 (ref 7.350–7.450)

## 2013-07-29 LAB — BASIC METABOLIC PANEL
BUN: 15 mg/dL (ref 6–23)
CO2: 30 mEq/L (ref 19–32)
Chloride: 97 mEq/L (ref 96–112)
GFR calc Af Amer: >90 mL/min (ref >90)
Potassium: 3.8 mEq/L (ref 3.5–5.1)

## 2013-07-29 LAB — CBC
HCT: 39.6 % (ref 39.0–52.0)
Hemoglobin: 13.6 g/dL (ref 13.0–17.0)
MCHC: 34.3 g/dL (ref 30.0–36.0)
MCV: 88.8 fL (ref 78.0–100.0)

## 2013-07-29 MED ORDER — PREDNISONE 20 MG PO TABS
40.0000 mg | ORAL_TABLET | Freq: Two times a day (BID) | ORAL | Status: DC
  Administered 2013-07-29 – 2013-07-31 (×4): 40 mg via ORAL
  Filled 2013-07-29 (×5): qty 2

## 2013-07-29 MED ORDER — PANTOPRAZOLE SODIUM 40 MG PO TBEC
40.0000 mg | DELAYED_RELEASE_TABLET | Freq: Every day | ORAL | Status: DC
  Administered 2013-07-29: 40 mg via ORAL

## 2013-07-29 MED ORDER — IPRATROPIUM BROMIDE 0.02 % IN SOLN
0.5000 mg | RESPIRATORY_TRACT | Status: DC
  Administered 2013-07-29 – 2013-07-30 (×7): 0.5 mg via RESPIRATORY_TRACT
  Filled 2013-07-29 (×7): qty 2.5

## 2013-07-29 MED ORDER — AZITHROMYCIN 500 MG PO TABS
500.0000 mg | ORAL_TABLET | Freq: Every day | ORAL | Status: AC
  Administered 2013-07-29 – 2013-07-30 (×2): 500 mg via ORAL
  Filled 2013-07-29 (×4): qty 1

## 2013-07-29 MED ORDER — DM-GUAIFENESIN ER 30-600 MG PO TB12
1.0000 | ORAL_TABLET | Freq: Two times a day (BID) | ORAL | Status: DC
  Administered 2013-07-29 – 2013-07-31 (×5): 1 via ORAL
  Filled 2013-07-29 (×8): qty 1

## 2013-07-29 MED ORDER — GNP PEPPERMINT SPIRIT SPRT
Status: DC | PRN
  Filled 2013-07-29: qty 30

## 2013-07-29 MED ORDER — ALBUTEROL SULFATE (5 MG/ML) 0.5% IN NEBU
2.5000 mg | INHALATION_SOLUTION | RESPIRATORY_TRACT | Status: DC
  Administered 2013-07-29 – 2013-07-30 (×7): 2.5 mg via RESPIRATORY_TRACT
  Filled 2013-07-29 (×7): qty 0.5

## 2013-07-29 MED ORDER — BUDESONIDE 0.5 MG/2ML IN SUSP
0.5000 mg | Freq: Two times a day (BID) | RESPIRATORY_TRACT | Status: DC
  Administered 2013-07-29 – 2013-07-31 (×4): 0.5 mg via RESPIRATORY_TRACT
  Filled 2013-07-29 (×7): qty 2

## 2013-07-29 MED ORDER — CYCLOBENZAPRINE HCL 5 MG PO TABS
5.0000 mg | ORAL_TABLET | Freq: Three times a day (TID) | ORAL | Status: DC | PRN
  Administered 2013-07-30 – 2013-07-31 (×2): 5 mg via ORAL
  Filled 2013-07-29 (×2): qty 1
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
## 2013-07-29 NOTE — Progress Notes (Signed)
Chart reviewed.  [facility] Progress Note  Anquan Azzarello ZOX:[id number] DOB: 03-01-1981 DOA: 07/25/2013 PCP: No PCP Per Patient  Admit HPI / Brief Narrative: 32 y.o. 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 The patient had been having shortness of breath and cough off and on for over a month. Cough was mostly dry and at times productive. His shortness of breath had been getting progressively worse - he had been using his inhalers very frequently without much relief. As a result he presented to the emergency room where he was found to have pneumonia.   Assessment/Plan:  Acute hypoxic respiratory failure Still with periods of hypoxia off O2 and DOE  Bilateral lower lobe community acquired pneumonia Only got 3 days azithro.  Will give 2 more days. Cont ceftin. Add mucolytics for congestion    Acute asthma exacerbation Change to po steroids.  Needs to quit smoking.  RT rec pulmocort. Have ordered  Chronic back pain with sciatica of right lower extremity  Code Status: FULL Family Communication: multiple family at bedside Disposition Plan: home   Consultants: None  Procedures: None  Antibiotics: Rocephin 9/18 >>9/21 Azithromycin 9/18 >>9/21 Ceftin 9/21 >>   DVT prophylaxis: lovenox  HPI/Subjective: C/o chest congestion. Scant epistaxis. DOE and dyspneic off oxygen  Objective: Blood pressure 126/71, pulse 104, temperature 98.4 F (36.9 C), temperature source Oral, resp. rate 17, height 6' (1.829 m), weight 86.5 kg (190 lb 11.2 oz), SpO2 97.00%.  Intake/Output Summary (Last 24 hours) at 07/29/13 1314 Last data filed at 07/28/13 2100  Gross per 24 hour  Intake    260 ml  Output    600 ml  Net   -340 ml   Exam: General: tripod, but short sentences. Lungs: quiet wheeze. Some rhonchi.  Good air movement Cardiovascular: Regular rate and rhythm  without appreciable murmur gallop or rub Abdomen: Nontender, nondistended, soft, bowel sounds positive, no rebound, no ascites, no appreciable mass Extremities: No significant cyanosis, clubbing, or edema bilateral lower extremities  Data Reviewed: Basic Metabolic Panel:  Recent Labs Lab 07/25/13 1937 07/29/13 0455  NA 138 136  K 4.3 3.8  CL 103 97  CO2 25 30  GLUCOSE 145* 152*  BUN 13 15  CREATININE 0.96 0.77  CALCIUM 8.4 8.8   CBC:  Recent Labs Lab 07/25/13 1937 07/29/13 0455  WBC 13.4* 17.5*  NEUTROABS 12.0*  --   HGB 14.1 13.6  HCT 39.7 39.6  MCV 87.1 88.8  PLT 225 320    Recent Results (from the past 240 hour(s))  CULTURE, BLOOD (ROUTINE X 2)     Status: None   Collection Time    07/25/13  7:35 PM      Result Value Range Status   Specimen Description BLOOD ARM RIGHT   Final   Special Requests BOTTLES DRAWN AEROBIC ONLY 10CC   Final   Culture  Setup Time     Final   Value: 07/26/2013 00:58     Performed at [facility]   Culture     Final   Value:        BLOOD CULTURE RECEIVED NO GROWTH TO DATE CULTURE WILL BE HELD FOR 5 DAYS BEFORE ISSUING A FINAL NEGATIVE REPORT     Performed at [facility]   Report Status PENDING   Incomplete  CULTURE, BLOOD (ROUTINE X 2)     Status: None  Collection Time    07/25/13  7:41 PM      Result Value Range Status   Specimen Description BLOOD HAND RIGHT   Final   Special Requests BOTTLES DRAWN AEROBIC ONLY 2.5CC   Final   Culture  Setup Time     Final   Value: 07/26/2013 00:57     Performed at [facility]   Culture     Final   Value:        BLOOD CULTURE RECEIVED NO GROWTH TO DATE CULTURE WILL BE HELD FOR 5 DAYS BEFORE ISSUING A FINAL NEGATIVE REPORT     Performed at [facility]   Report Status PENDING   Incomplete  MRSA PCR SCREENING     Status: None   Collection Time    07/25/13  8:16 PM      Result Value Range Status   MRSA by PCR NEGATIVE  NEGATIVE Final   Comment:             The GeneXpert MRSA Assay (FDA     approved for NASAL specimens     only), is one component of a     comprehensive MRSA colonization     surveillance program. It is not     intended to diagnose MRSA     infection nor to guide or     monitor treatment for     MRSA infections.  CULTURE, EXPECTORATED SPUTUM-ASSESSMENT     Status: None   Collection Time    07/26/13  2:37 AM      Result Value Range Status   Specimen Description SPUTUM   Final   Special Requests NONE   Final   Sputum evaluation     Final   Value: THIS SPECIMEN IS ACCEPTABLE. RESPIRATORY CULTURE REPORT TO FOLLOW.   Report Status 07/26/2013 FINAL   Final  CULTURE, RESPIRATORY (NON-EXPECTORATED)     Status: None   Collection Time    07/26/13  2:37 AM      Result Value Range Status   Specimen Description SPUTUM   Final   Special Requests NONE   Final   Gram Stain     Final   Value: MODERATE WBC PRESENT,BOTH PMN AND MONONUCLEAR     RARE SQUAMOUS EPITHELIAL CELLS PRESENT     FEW GRAM POSITIVE COCCI     IN PAIRS RARE GRAM NEGATIVE RODS     RARE GRAM POSITIVE RODS   Culture     Final   Value: NORMAL OROPHARYNGEAL FLORA     Performed at [facility]   Report Status 07/28/2013 FINAL   Final     Studies:  Recent x-ray studies have been reviewed in detail by the Attending Physician  Scheduled Meds:  Scheduled Meds: . albuterol  2.5 mg Nebulization Q4H  . budesonide (PULMICORT) nebulizer solution  0.5 mg Nebulization BID  . cefUROXime  500 mg Oral BID WC  . enoxaparin (LOVENOX) injection  40 mg Subcutaneous Q24H  . ipratropium  0.5 mg Nebulization Q4H  . loratadine  10 mg Oral Daily  . methylPREDNISolone (SOLU-MEDROL) injection  40 mg Intravenous Q12H  . nicotine  14 mg Transdermal Daily  . pantoprazole  40 mg Oral Daily    Time spent on care of this patient: 25 mins   Christiane Ha, M.D.  07/29/2013, 1:14 PM   LOS: 4 days

## 2013-07-30 DIAGNOSIS — J9601 Acute respiratory failure with hypoxia: Secondary | ICD-10-CM | POA: Diagnosis present

## 2013-07-30 MED ORDER — IPRATROPIUM-ALBUTEROL 20-100 MCG/ACT IN AERS
1.0000 | INHALATION_SPRAY | Freq: Four times a day (QID) | RESPIRATORY_TRACT | Status: DC
  Administered 2013-07-30 – 2013-07-31 (×3): 1 via RESPIRATORY_TRACT
  Filled 2013-07-30 (×2): qty 4

## 2013-07-30 NOTE — Progress Notes (Addendum)
SATURATION QUALIFICATIONS: (This note is used to comply with regulatory documentation for home oxygen)  Patient Saturations on Room Air at Rest =92%  Patient Saturations on Room Air while Ambulating =88%  Patient Saturations on 2 Liters of oxygen while Ambulating =92%  Please briefly explain why patient needs home oxygen:SOB on exertion

## 2013-07-30 NOTE — Progress Notes (Signed)
Chart reviewed.  [facility] Progress Note  Jacob Vaughan WUJ:[id number] DOB: 15-Oct-1981 DOA: 07/25/2013 PCP: No PCP Per Patient  Admit HPI / Brief Narrative: 32 y.o. 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 The patient had been having shortness of breath and cough off and on for over a month. Cough was mostly dry and at times productive. His shortness of breath had been getting progressively worse - he had been using his inhalers very frequently without much relief. As a result he presented to the emergency room where he was found to have pneumonia.   Assessment/Plan:  Acute hypoxic respiratory failure Still with periods of hypoxia off O2 and DOE.  sats 88% with ambulation.  Will arrange home O2.  Increase ambulation with portable O2.  Likely home in am  Bilateral lower lobe community acquired pneumonia Cont current    Acute asthma exacerbation with continued tobacco abuse Cont pred.  Change to combivent MDI (so patient may take home).  Quit smoking  Chronic back pain with sciatica of right lower extremity  Code Status: FULL Family Communication: multiple family at bedside Disposition Plan: home   Consultants: None  Procedures: None  Antibiotics: Rocephin 9/18 >>9/21 Azithromycin 9/18 >>9/21 Ceftin 9/21 >>   DVT prophylaxis: lovenox  HPI/Subjective: Slightly improved.  Less congestion, but still DOE.  Does not feel well enough to go home  Objective: Blood pressure 128/75, pulse 91, temperature 98.2 F (36.8 C), temperature source Oral, resp. rate 17, height 6' (1.829 m), weight 86.5 kg (190 lb 11.2 oz), SpO2 95.00%.  Intake/Output Summary (Last 24 hours) at 07/30/13 1415 Last data filed at 07/30/13 0534  Gross per 24 hour  Intake      0 ml  Output    950 ml  Net   -950 ml   Exam: General: comfotable. Lungs: Good air movement. Minimal  rhonchi.  No wheeze Cardiovascular: Regular rate and rhythm without appreciable murmur gallop or rub Abdomen: Nontender, nondistended, soft, bowel sounds positive, no rebound, no ascites, no appreciable mass Extremities: No significant cyanosis, clubbing, or edema bilateral lower extremities  Data Reviewed: Basic Metabolic Panel:  Recent Labs Lab 07/25/13 1937 07/29/13 0455  NA 138 136  K 4.3 3.8  CL 103 97  CO2 25 30  GLUCOSE 145* 152*  BUN 13 15  CREATININE 0.96 0.77  CALCIUM 8.4 8.8   CBC:  Recent Labs Lab 07/25/13 1937 07/29/13 0455  WBC 13.4* 17.5*  NEUTROABS 12.0*  --   HGB 14.1 13.6  HCT 39.7 39.6  MCV 87.1 88.8  PLT 225 320    Recent Results (from the past 240 hour(s))  CULTURE, BLOOD (ROUTINE X 2)     Status: None   Collection Time    07/25/13  7:35 PM      Result Value Range Status   Specimen Description BLOOD ARM RIGHT   Final   Special Requests BOTTLES DRAWN AEROBIC ONLY 10CC   Final   Culture  Setup Time     Final   Value: 07/26/2013 00:58     Performed at [facility]   Culture     Final   Value:        BLOOD CULTURE RECEIVED NO GROWTH TO DATE CULTURE WILL BE HELD FOR 5 DAYS BEFORE ISSUING A FINAL NEGATIVE REPORT     Performed at [facility]   Report Status PENDING  Incomplete  CULTURE, BLOOD (ROUTINE X 2)     Status: None   Collection Time    07/25/13  7:41 PM      Result Value Range Status   Specimen Description BLOOD HAND RIGHT   Final   Special Requests BOTTLES DRAWN AEROBIC ONLY 2.5CC   Final   Culture  Setup Time     Final   Value: 07/26/2013 00:57     Performed at [facility]   Culture     Final   Value:        BLOOD CULTURE RECEIVED NO GROWTH TO DATE CULTURE WILL BE HELD FOR 5 DAYS BEFORE ISSUING A FINAL NEGATIVE REPORT     Performed at [facility]   Report Status PENDING   Incomplete  MRSA PCR SCREENING     Status: None   Collection Time    07/25/13  8:16 PM      Result Value Range Status    MRSA by PCR NEGATIVE  NEGATIVE Final   Comment:            The GeneXpert MRSA Assay (FDA     approved for NASAL specimens     only), is one component of a     comprehensive MRSA colonization     surveillance program. It is not     intended to diagnose MRSA     infection nor to guide or     monitor treatment for     MRSA infections.  CULTURE, EXPECTORATED SPUTUM-ASSESSMENT     Status: None   Collection Time    07/26/13  2:37 AM      Result Value Range Status   Specimen Description SPUTUM   Final   Special Requests NONE   Final   Sputum evaluation     Final   Value: THIS SPECIMEN IS ACCEPTABLE. RESPIRATORY CULTURE REPORT TO FOLLOW.   Report Status 07/26/2013 FINAL   Final  CULTURE, RESPIRATORY (NON-EXPECTORATED)     Status: None   Collection Time    07/26/13  2:37 AM      Result Value Range Status   Specimen Description SPUTUM   Final   Special Requests NONE   Final   Gram Stain     Final   Value: MODERATE WBC PRESENT,BOTH PMN AND MONONUCLEAR     RARE SQUAMOUS EPITHELIAL CELLS PRESENT     FEW GRAM POSITIVE COCCI     IN PAIRS RARE GRAM NEGATIVE RODS     RARE GRAM POSITIVE RODS   Culture     Final   Value: NORMAL OROPHARYNGEAL FLORA     Performed at [facility]   Report Status 07/28/2013 FINAL   Final     Studies:  Recent x-ray studies have been reviewed in detail by the Attending Physician  Scheduled Meds:  Scheduled Meds: . budesonide (PULMICORT) nebulizer solution  0.5 mg Nebulization BID  . cefUROXime  500 mg Oral BID WC  . dextromethorphan-guaiFENesin  1 tablet Oral BID  . enoxaparin (LOVENOX) injection  40 mg Subcutaneous Q24H  . Ipratropium-Albuterol  1 puff Inhalation QID  . loratadine  10 mg Oral Daily  . nicotine  14 mg Transdermal Daily  . predniSONE  40 mg Oral BID    Time spent on care of this patient: 25 mins   Christiane Ha, M.D.  07/30/2013, 2:15 PM   LOS: 5 days

## 2013-07-31 MED ORDER — NICOTINE 14 MG/24HR TD PT24: 1.0000 | MEDICATED_PATCH | Freq: Every day | TRANSDERMAL | Status: DC

## 2013-07-31 MED ORDER — CEFUROXIME AXETIL 500 MG PO TABS: 500.0000 mg | ORAL_TABLET | Freq: Two times a day (BID) | ORAL | Status: DC

## 2013-07-31 MED ORDER — DM-GUAIFENESIN ER 30-600 MG PO TB12: 1.0000 | ORAL_TABLET | Freq: Two times a day (BID) | ORAL | Status: DC

## 2013-07-31 MED ORDER — IPRATROPIUM-ALBUTEROL 20-100 MCG/ACT IN AERS: 1.0000 | INHALATION_SPRAY | Freq: Four times a day (QID) | RESPIRATORY_TRACT | Status: DC

## 2013-07-31 NOTE — Progress Notes (Signed)
Discharge instructions reviewed with pt and significant other and instructed on where to pick up prescriptions.  Pt and significant other verbalized understanding and questions answered.  Home O2 has been arranged and O2 delivered to pt's room.  Pt discharged in stable condition via wheelchair with O2 with significant other.  Hector Shade [place]
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
## 2013-07-31 NOTE — Plan of Care (Signed)
Problem: Discharge Progression Outcomes Goal: O2 sats at patient's baseline Outcome: Adequate for Discharge Pt went home with home O2

## 2013-08-01 LAB — CULTURE, BLOOD (ROUTINE X 2)
Culture: NO GROWTH
Culture: NO GROWTH

## 2013-08-06 ENCOUNTER — Ambulatory Visit: Payer: Self-pay | Attending: Internal Medicine | Admitting: Internal Medicine

## 2013-08-06 VITALS — BP 108/68 | HR 79 | Temp 98.7°F | Resp 16 | Ht 70.47 in | Wt 193.0 lb

## 2013-08-06 DIAGNOSIS — J441 Chronic obstructive pulmonary disease with (acute) exacerbation: Secondary | ICD-10-CM | POA: Insufficient documentation

## 2013-08-06 MED ORDER — ALBUTEROL SULFATE (2.5 MG/3ML) 0.083% IN NEBU: 2.5000 mg | INHALATION_SOLUTION | Freq: Four times a day (QID) | RESPIRATORY_TRACT | Status: DC | PRN

## 2013-08-06 MED ORDER — OXYCODONE-ACETAMINOPHEN 5-325 MG PO TABS: 1.0000 | ORAL_TABLET | ORAL | Status: DC | PRN

## 2013-08-06 NOTE — Progress Notes (Signed)
[facility] here to establish care. Pt was admitted to the Hospital with an asthma attack. He also had pneumonia. Pt reports that he doing 100% better after taking medications.

## 2013-08-06 NOTE — Patient Instructions (Signed)
Asthma, Adult Asthma is a condition that affects your lungs. It is characterized by swelling and narrowing of your airways as well as increased mucus production. The narrowing comes from swelling and muscle spasms inside the airways. When this happens, breathing can be difficult and you can have coughing, wheezing, and shortness of breath. Knowing more about asthma can help you manage it better. Asthma cannot be cured, but medicines and lifestyle changes can help control it. Asthma can be a minor problem for some people but if it is not controlled it can lead to a life-threatening asthma attack. Asthma can change over time. It is important to work with your caregiver to manage your asthma symptoms. CAUSES The exact cause of asthma is unknown. Asthma is believed to be caused by inherited (genetic) and environmental exposures. Swelling and redness (inflammation) of the airways occurs in asthma. This can be triggered by allergies, viral lung infections, or irritants in the air. Allergic reactions can cause you to wheeze immediately or several hours after an exposure. Asthma triggers are different for each person. It is important to pay attention and know what triggers your asthma.  Common triggers for asthma attacks include:  Animal dander from the skin, hair, or feathers of animals.  Dust mites contained in house dust.  Cockroaches.  Pollen from trees or grass.  Mold.  Cigarette or tobacco smoke. Smoking cannot be allowed in homes of people with asthma. People with asthma should not smoke and should not be around smokers.  Air pollutants such as dust, household cleaners, hair sprays, aerosol sprays, paint fumes, strong chemicals, or strong odors.  Cold air or weather changes. Cold air may cause inflammation. Winds increase molds and pollens in the air. There is not one best climate for people with asthma.  Strong emotions such as crying or laughing hard.  Stress.  Certain medicines such as  aspirin or beta-blockers.  Sulfites in such foods and drinks as dried fruits and wine.  Infections or inflammatory conditions such as the flu, a cold, or an inflammation of the nasal membranes (rhinitis).  Gastroesophageal reflux disease (GERD). GERD is a condition where stomach acid backs up into your throat (esophagus).  Exercise or strenous activity. Proper pre-exercise medicines allow most people to participate in sports. SYMPTOMS  Feeling short of breath.  Chest tightness or pain.  Difficulty sleeping due to coughing, wheezing, or feeling short of breath.  A whistling or wheezing sound with exhalation.  Coughing or wheezing that is worse when you:  Have a virus (such as a cold or the flu).  Are suffering from allergies.  Are exposed to certain fumes or chemicals.  Exercise. Signs that your asthma is probably getting worse include:   More frequent and bothersome asthma signs and symptoms.  Increasing difficulty breathing. This can be measured by a peak flow meter, which is a simple device used to check how well your lungs are working.  An increasingly frequent need to use a quick-relief inhaler. DIAGNOSIS  The diagnosis of asthma is made by review of your medical history, a physical exam, and possibly from other tests. Lung function studies may help with the diagnosis. TREATMENT  Asthma cannot be cured. However, for the majority of adults, asthma can be controlled with treatment. Besides avoidance of triggers of your asthma, medicines are often required. There are 2 classes of medicine used for asthma treatment: controller medicines (reduce inflammation and symptoms) andreliever or rescue medicines (relieve asthma symptoms during acute attacks). You may require daily   medicines to control your asthma. The most effective long-term controller medicines for asthma are inhaled corticosteroids (blocks inflammation). Other long-term control medicines include:  Leukotriene  receptor antagonists (blocks a pathway of inflammation).  Long-acting beta2-agonists (relaxes the muscles of the airways for at least 12 hours) with an inhaled corticosteroid.  Cromolyn sodium or nedocromil (alters certain inflammatory cells' ability to release chemicals that cause inflammation).  Immunomodulators (alters the immune system to prevent asthma symptoms).  Theophylline (relaxes muscles in the airways). You may also require a short-acting beta2-agonist to relieve asthma symptoms during an acute attack. You should understand what to do during an acute attack. Inhaled medicines are effective when used properly. Read the instructions on how to use your medicines correctly and speak to your caregiver if you have questions. Follow up with your caregiver on a regular basis to make sure your asthma is well-controlled. If your asthma is not well-controlled, if you have been hospitalized for asthma, or if multiple medicines or medium to high doses of inhaled corticosteroids are needed to control your asthma, request a referral to an asthma specialist. HOME CARE INSTRUCTIONS   Take medicines as directed by your caregiver.  Control your home environment in the following ways to help prevent asthma attacks:  Change your heating and air conditioning filter at least once a month.  Place a filter or cheesecloth over your heating and air conditioning vents.  Limit the use of fireplaces and wood stoves.  Do not smoke. Do not stay in places where others are smoking.  Get rid of pests (such as roaches and mice) and their droppings.  If you see mold on a plant, throw it away.  Clean your floors and dust every week. Use unscented cleaning products. Use a vacuum cleaner with a HEPA filter if possible. If vacuuming or cleaning triggers your asthma, try to find someone else to do these chores.  Floors in your house should be wood, tile, or vinyl. Carpet can trap dander and dust.  Use  allergy-proof pillows, mattress covers, and box spring covers.  Wash bedsheets and blankets every week in hot water and dry in a dryer.  Use a blanket that is made of polyester or cotton with a tight nap.  Do not use a dust ruffle on your bed.  Clean bathrooms and kitchens with bleach and repaint with mold-resistant paint.  Wash hands frequently.  Talk to your caregiver about an action plan for managing asthma attacks. This includes the use of a peak flow meter which measures the severity of the attack and medicines that can help stop the attack. An action plan can help minimize or stop the attack without having to seek medical care.  Remain calm during an asthma attack.  Always have a plan prepared for seeking medical attention. This should include contacting your caregiver and in the case of a severe attack, calling your local emergency services (911 in [place] SEEK MEDICAL CARE IF:   You have wheezing, shortness of breath, or a cough even if taking medicine to prevent attacks.  You have thickening of sputum.  Your sputum changes from clear or white to yellow, green, gray, or bloody.  You have any problems that may be related to the medicines you are taking (such as a rash, itching, swelling, or trouble breathing).  You are using a reliever medicine more than 2 3 times per week.  Your peak flow is still at 50 79% of personal best after following your action plan for 1   hour. SEEK IMMEDIATE MEDICAL CARE IF:   You are short of breath even at rest.  You get short of breath when doing very little physical activity.  You have difficulty eating, drinking, or talking due to asthma symptoms.  You have chest pain or you feel that your heart is beating fast.  You have a bluish color to your lips or fingernails.  You are lightheaded, dizzy, or faint.  You have a fever or persistent symptoms for more than 2 3 days.  You have a fever and symptoms suddenly get worse.  You seem to be  getting worse and are unresponsive to treatment during an asthma attack.  Your peak flow is less than 50% of personal best. MAKE SURE YOU:   Understand these instructions.  Will watch your condition.  Will get help right away if you are not doing well or get worse. Document Released: 10/24/2005 Document Revised: 10/10/2012 Document Reviewed: 06/11/2008 [employer] Patient Information 2014 [employer].  

## 2013-08-06 NOTE — Progress Notes (Signed)
Patient ID: Jacob Vaughan, male   DOB: September 14, 1981, 32 y.o.   MRN: [id number]  CC: Hospital followup  HPI: Patient is 32 year old male who was recently hospitalized for acute respiratory failure secondary to asthma COPD exacerbation. Patient explains he is doing well, denies shortness of breath or chest pain at this time. He has completed therapy with antibiotics. He has stopped smoking since his recent hospitalizations and has no intentions of starting to smoke again. He denies fevers and chills, no specific abdominal or urinary concerns. He reports compliance with medications.  No Known Allergies Past Medical History  Diagnosis Date  . Asthma    Current Outpatient Prescriptions on File Prior to Visit  Medication Sig Dispense Refill  . ibuprofen (ADVIL,MOTRIN) 600 MG tablet Take 1 tablet (600 mg total) by mouth every 6 (six) hours as needed for pain.  30 tablet  0  . Ipratropium-Albuterol (COMBIVENT) 20-100 MCG/ACT AERS respimat Inhale 1 puff into the lungs 4 (four) times daily.  1 Inhaler  0  . traMADol (ULTRAM) 50 MG tablet Take 1 tablet (50 mg total) by mouth every 6 (six) hours as needed for pain.  15 tablet  0  . cefUROXime (CEFTIN) 500 MG tablet Take 1 tablet (500 mg total) by mouth 2 (two) times daily with a meal.  6 tablet  0  . cyclobenzaprine (FLEXERIL) 10 MG tablet Take 1 tablet (10 mg total) by mouth 2 (two) times daily as needed for muscle spasms.  20 tablet  0  . dextromethorphan-guaiFENesin (MUCINEX DM) 30-600 MG per 12 hr tablet Take 1 tablet by mouth 2 (two) times daily.  14 tablet  0  . nicotine (NICODERM CQ - DOSED IN MG/24 HOURS) 14 mg/24hr patch Place 1 patch onto the skin daily.  28 patch  0   No current facility-administered medications on file prior to visit.   No family history of cancers History   Social History  . Marital Status: Single    Spouse Name: N/A    Number of Children: N/A  . Years of Education: N/A   Occupational History  . Not on file.   Social  History Main Topics  . Smoking status: Former Smoker    Quit date: 08/04/2013  . Smokeless tobacco: Not on file  . Alcohol Use: Yes     Comment: occ  . Drug Use: Yes    Special: Marijuana  . Sexual Activity: Not on file   Other Topics Concern  . Not on file   Social History Narrative  . No narrative on file    Review of Systems  Constitutional: Negative for fever, chills, diaphoresis, activity change, appetite change and fatigue.  HENT: Negative for ear pain, nosebleeds, congestion, facial swelling, rhinorrhea, neck pain, neck stiffness and ear discharge.   Eyes: Negative for pain, discharge, redness, itching and visual disturbance.  Respiratory: Negative for cough, choking, chest tightness, shortness of breath, wheezing and stridor.   Cardiovascular: Negative for chest pain, palpitations and leg swelling.  Gastrointestinal: Negative for abdominal distention.  Genitourinary: Negative for dysuria, urgency, frequency, hematuria, flank pain, decreased urine volume, difficulty urinating and dyspareunia.  Musculoskeletal: Negative for back pain, joint swelling, arthralgias and gait problem.  Neurological: Negative for dizziness, tremors, seizures, syncope, facial asymmetry, speech difficulty, weakness, light-headedness, numbness and headaches.  Hematological: Negative for adenopathy. Does not bruise/bleed easily.  Psychiatric/Behavioral: Negative for hallucinations, behavioral problems, confusion, dysphoric mood, decreased concentration and agitation.    Objective:   Filed Vitals:   08/06/13 1254  BP: 108/68  Pulse: 79  Temp: 98.7 F (37.1 C)  Resp: 16    Physical Exam  Constitutional: Appears well-developed and well-nourished. No distress.  CVS: RRR, S1/S2 +, no murmurs, no gallops, no carotid bruit.  Pulmonary: Effort and breath sounds normal, no stridor, rhonchi, wheezes, rales.  Abdominal: Soft. BS +,  no distension, tenderness, rebound or guarding.    Lab Results   Component Value Date   WBC 17.5* 07/29/2013   HGB 13.6 07/29/2013   HCT 39.6 07/29/2013   MCV 88.8 07/29/2013   PLT 320 07/29/2013   Lab Results  Component Value Date   CREATININE 0.77 07/29/2013   BUN 15 07/29/2013   NA 136 07/29/2013   K 3.8 07/29/2013   CL 97 07/29/2013   CO2 30 07/29/2013    No results found for this basename: HGBA1C   Lipid Panel  No results found for this basename: chol, trig, hdl, cholhdl, vldl, ldlcalc       Assessment and plan:   Patient Active Problem List   Diagnosis Date Noted  . Acute respiratory failure with hypoxia - this was determined to be secondary to asthma and COPD exacerbation. Patient appears to be doing well, no findings on exam noted on today's visit. Patient advised to continue compliance with recommendations on smoking cessation. Patient also advised to call doctor or to go to emergency department if his symptoms get worse.  07/30/2013

## 2013-11-19 ENCOUNTER — Encounter (HOSPITAL_COMMUNITY): Payer: Self-pay | Admitting: Emergency Medicine

## 2013-11-19 ENCOUNTER — Emergency Department (HOSPITAL_COMMUNITY): Payer: Self-pay

## 2013-11-19 ENCOUNTER — Observation Stay (HOSPITAL_COMMUNITY)
Admission: EM | Admit: 2013-11-19 | Discharge: 2013-11-20 | Disposition: A | Discharge status: Home / Self Care | Payer: Self-pay | Attending: Internal Medicine | Admitting: Internal Medicine

## 2013-11-19 DIAGNOSIS — J09X2 Influenza due to identified novel influenza A virus with other respiratory manifestations: Secondary | ICD-10-CM

## 2013-11-19 DIAGNOSIS — Z9981 Dependence on supplemental oxygen: Secondary | ICD-10-CM | POA: Insufficient documentation

## 2013-11-19 DIAGNOSIS — F17200 Nicotine dependence, unspecified, uncomplicated: Secondary | ICD-10-CM | POA: Insufficient documentation

## 2013-11-19 DIAGNOSIS — Z720 Tobacco use: Secondary | ICD-10-CM

## 2013-11-19 DIAGNOSIS — J111 Influenza due to unidentified influenza virus with other respiratory manifestations: Secondary | ICD-10-CM | POA: Insufficient documentation

## 2013-11-19 DIAGNOSIS — J45901 Unspecified asthma with (acute) exacerbation: Principal | ICD-10-CM | POA: Diagnosis present

## 2013-11-19 DIAGNOSIS — J9610 Chronic respiratory failure, unspecified whether with hypoxia or hypercapnia: Secondary | ICD-10-CM | POA: Insufficient documentation

## 2013-11-19 DIAGNOSIS — J45902 Unspecified asthma with status asthmaticus: Secondary | ICD-10-CM

## 2013-11-19 HISTORY — DX: Pneumonia, unspecified organism: J18.9

## 2013-11-19 HISTORY — DX: Dependence on supplemental oxygen: Z99.81

## 2013-11-19 HISTORY — DX: Shortness of breath: R06.02

## 2013-11-19 LAB — POCT I-STAT 3, ART BLOOD GAS (G3+)
Acid-base deficit: 1 mmol/L (ref 0.0–2.0)
Bicarbonate: 20.9 mEq/L (ref 20.0–24.0)
O2 Saturation: 96 %
PCO2 ART: 25.9 mmHg — AB (ref 35.0–45.0)
PH ART: 7.514 — AB (ref 7.350–7.450)
PO2 ART: 74 mmHg — AB (ref 80.0–100.0)
TCO2: 22 mmol/L (ref 0–100)

## 2013-11-19 LAB — CBC WITH DIFFERENTIAL/PLATELET
Basophils Absolute: 0 10*3/uL (ref 0.0–0.1)
Basophils Relative: 0 % (ref 0–1)
Eosinophils Absolute: 0 10*3/uL (ref 0.0–0.7)
Eosinophils Relative: 1 % (ref 0–5)
HEMATOCRIT: 41.7 % (ref 39.0–52.0)
HEMOGLOBIN: 14.4 g/dL (ref 13.0–17.0)
LYMPHS ABS: 1.3 10*3/uL (ref 0.7–4.0)
Lymphocytes Relative: 22 % (ref 12–46)
MCH: 29.6 pg (ref 26.0–34.0)
MCHC: 34.5 g/dL (ref 30.0–36.0)
MCV: 85.6 fL (ref 78.0–100.0)
MONOS PCT: 8 % (ref 3–12)
Monocytes Absolute: 0.5 10*3/uL (ref 0.1–1.0)
NEUTROS ABS: 4 10*3/uL (ref 1.7–7.7)
Neutrophils Relative %: 69 % (ref 43–77)
Platelets: 218 10*3/uL (ref 150–400)
RBC: 4.87 MIL/uL (ref 4.22–5.81)
RDW: 14 % (ref 11.5–15.5)
WBC: 5.8 10*3/uL (ref 4.0–10.5)

## 2013-11-19 LAB — BASIC METABOLIC PANEL
BUN: 15 mg/dL (ref 6–23)
CHLORIDE: 95 meq/L — AB (ref 96–112)
CO2: 23 meq/L (ref 19–32)
Calcium: 8.8 mg/dL (ref 8.4–10.5)
Creatinine, Ser: 1.09 mg/dL (ref 0.50–1.35)
GFR calc Af Amer: >90 mL/min (ref >90)
GFR calc non Af Amer: 88 mL/min — ABNORMAL LOW (ref >90)
GLUCOSE: 102 mg/dL — AB (ref 70–99)
Potassium: 3.7 mEq/L (ref 3.7–5.3)
Sodium: 132 mEq/L — ABNORMAL LOW (ref 137–147)

## 2013-11-19 LAB — INFLUENZA PANEL BY PCR (TYPE A & B)
H1N1 flu by pcr: NOT DETECTED
INFLAPCR: POSITIVE — AB
Influenza B By PCR: NEGATIVE

## 2013-11-19 MED ORDER — ALBUTEROL SULFATE (2.5 MG/3ML) 0.083% IN NEBU: 2.5000 mg | INHALATION_SOLUTION | RESPIRATORY_TRACT | Status: DC | PRN

## 2013-11-19 MED ORDER — HEPARIN SODIUM (PORCINE) 5000 UNIT/ML IJ SOLN
5000.0000 [IU] | Freq: Three times a day (TID) | INTRAMUSCULAR | Status: DC
  Administered 2013-11-19 – 2013-11-20 (×2): 5000 [IU] via SUBCUTANEOUS
  Filled 2013-11-19 (×5): qty 1

## 2013-11-19 MED ORDER — ALBUTEROL (5 MG/ML) CONTINUOUS INHALATION SOLN
10.0000 mg/h | INHALATION_SOLUTION | RESPIRATORY_TRACT | Status: DC
  Administered 2013-11-19: 10 mg/h via RESPIRATORY_TRACT
  Filled 2013-11-19: qty 20

## 2013-11-19 MED ORDER — OSELTAMIVIR PHOSPHATE 75 MG PO CAPS
75.0000 mg | ORAL_CAPSULE | Freq: Once | ORAL | Status: AC
  Administered 2013-11-19: 75 mg via ORAL
  Filled 2013-11-19 (×2): qty 1

## 2013-11-19 MED ORDER — OSELTAMIVIR PHOSPHATE 75 MG PO CAPS
75.0000 mg | ORAL_CAPSULE | Freq: Two times a day (BID) | ORAL | Status: DC
  Administered 2013-11-19 – 2013-11-20 (×2): 75 mg via ORAL
  Filled 2013-11-19 (×4): qty 1

## 2013-11-19 MED ORDER — IPRATROPIUM BROMIDE 0.02 % IN SOLN
0.5000 mg | Freq: Once | RESPIRATORY_TRACT | Status: AC
  Administered 2013-11-19: 0.5 mg via RESPIRATORY_TRACT
  Filled 2013-11-19: qty 2.5

## 2013-11-19 MED ORDER — METHYLPREDNISOLONE SODIUM SUCC 125 MG IJ SOLR
125.0000 mg | Freq: Once | INTRAMUSCULAR | Status: AC
  Administered 2013-11-19: 125 mg via INTRAVENOUS
  Filled 2013-11-19: qty 2

## 2013-11-19 MED ORDER — SODIUM CHLORIDE 0.9 % IV SOLN
INTRAVENOUS | Status: DC
  Administered 2013-11-19 – 2013-11-20 (×3): via INTRAVENOUS
  Administered 2013-11-20: 10:00:00 800 mL via INTRAVENOUS

## 2013-11-19 MED ORDER — ALBUTEROL SULFATE (2.5 MG/3ML) 0.083% IN NEBU
5.0000 mg | INHALATION_SOLUTION | Freq: Once | RESPIRATORY_TRACT | Status: AC
  Administered 2013-11-19: 5 mg via RESPIRATORY_TRACT
  Filled 2013-11-19: qty 6

## 2013-11-19 MED ORDER — ACETAMINOPHEN 500 MG PO TABS
1000.0000 mg | ORAL_TABLET | Freq: Once | ORAL | Status: AC
  Administered 2013-11-19: 1000 mg via ORAL
  Filled 2013-11-19: qty 2

## 2013-11-19 MED ORDER — METHYLPREDNISOLONE SODIUM SUCC 125 MG IJ SOLR
125.0000 mg | Freq: Three times a day (TID) | INTRAMUSCULAR | Status: DC
  Administered 2013-11-19 – 2013-11-20 (×2): 125 mg via INTRAVENOUS
  Filled 2013-11-19 (×5): qty 2

## 2013-11-19 MED ORDER — MAGNESIUM SULFATE 40 MG/ML IJ SOLN
2.0000 g | Freq: Once | INTRAMUSCULAR | Status: AC
  Administered 2013-11-19: 2 g via INTRAVENOUS
  Filled 2013-11-19: qty 50

## 2013-11-19 MED ORDER — IPRATROPIUM BROMIDE 0.02 % IN SOLN
0.5000 mg | RESPIRATORY_TRACT | Status: DC
  Administered 2013-11-19 – 2013-11-20 (×4): 0.5 mg via RESPIRATORY_TRACT
  Filled 2013-11-19 (×4): qty 2.5

## 2013-11-19 MED ORDER — ALBUTEROL SULFATE (2.5 MG/3ML) 0.083% IN NEBU
2.5000 mg | INHALATION_SOLUTION | RESPIRATORY_TRACT | Status: DC
  Administered 2013-11-19 – 2013-11-20 (×4): 2.5 mg via RESPIRATORY_TRACT
  Filled 2013-11-19 (×4): qty 3

## 2013-11-19 MED ORDER — IPRATROPIUM BROMIDE 0.02 % IN SOLN: 0.5000 mg | RESPIRATORY_TRACT | Status: DC | PRN

## 2013-11-19 NOTE — ED Notes (Signed)
Placed pt on 2 liters of oxygen due pt complaining of shortness of breath

## 2013-11-19 NOTE — Progress Notes (Signed)
Unit CM UR Completed by [facility] ED CM  W. Arita Severtson RN  

## 2013-11-19 NOTE — ED Provider Notes (Signed)
CSN: [id number]     Arrival date & time 11/19/13  1259 History   First MD Initiated Contact with Patient 11/19/13 1509     Chief Complaint  Patient presents with  . Cough  . Fever  . Asthma   (Consider location/radiation/quality/duration/timing/severity/associated sxs/prior Treatment) HPI Comments: Patient presents to the ER for evaluation of difficulty breathing. She reports a story with cough and chest congestion yesterday, since then has had progressive worsening of his asthma. He has been using his inhaler but only has had slight improvement. Patient reports that everyone in his family had the flu last week, multiple sick contacts. Patient has had fever and chills today in addition to his cough and wheezing.  Patient is a 33 y.o. male presenting with cough, fever, and asthma.  Cough Associated symptoms: chills, fever and wheezing   Fever Associated symptoms: chills and cough   Asthma    Past Medical History  Diagnosis Date  . Asthma    History reviewed. No pertinent past surgical history. No family history on file. History  Substance Use Topics  . Smoking status: Former Smoker    Quit date: 08/04/2013  . Smokeless tobacco: Not on file  . Alcohol Use: Yes     Comment: occ    Review of Systems  Constitutional: Positive for fever and chills.  Respiratory: Positive for cough and wheezing.   All other systems reviewed and are negative.    Allergies  Review of patient's allergies indicates no known allergies.  Home Medications   Current Outpatient Rx  Name  Route  Sig  Dispense  Refill  . albuterol (PROVENTIL) (2.5 MG/3ML) 0.083% nebulizer solution   Nebulization   Take 3 mLs (2.5 mg total) by nebulization every 6 (six) hours as needed for wheezing.   150 mL   11   . dextromethorphan-guaiFENesin (MUCINEX DM) 30-600 MG per 12 hr tablet   Oral   Take 1 tablet by mouth 2 (two) times daily.   14 tablet   0   . ibuprofen (ADVIL,MOTRIN) 600 MG tablet   Oral  Take 1 tablet (600 mg total) by mouth every 6 (six) hours as needed for pain.   30 tablet   0   . nicotine (NICODERM CQ - DOSED IN MG/24 HOURS) 14 mg/24hr patch   Transdermal   Place 1 patch onto the skin daily.   28 patch   0   . oxyCODONE-acetaminophen (ROXICET) 5-325 MG per tablet   Oral   Take 1 tablet by mouth every 4 (four) hours as needed for pain.   30 tablet   0   . traMADol (ULTRAM) 50 MG tablet   Oral   Take 1 tablet (50 mg total) by mouth every 6 (six) hours as needed for pain.   15 tablet   0    BP 111/63  Pulse 117  Temp(Src) 100.5 F (38.1 C) (Oral)  Resp 24  Ht 6' (1.829 m)  Wt 208 lb (94.348 kg)  BMI 28.20 kg/m2  SpO2 94% Physical Exam  Constitutional: He is oriented to person, place, and time. He appears well-developed and well-nourished. No distress.  HENT:  Head: Normocephalic and atraumatic.  Right Ear: Hearing normal.  Left Ear: Hearing normal.  Nose: Nose normal.  Mouth/Throat: Oropharynx is clear and moist and mucous membranes are normal.  Eyes: Conjunctivae and EOM are normal. Pupils are equal, round, and reactive to light.  Neck: Normal range of motion. Neck supple.  Cardiovascular: Regular rhythm, S1 normal  and S2 normal.  Exam reveals no gallop and no friction rub.   No murmur heard. Pulmonary/Chest: Accessory muscle usage present. Tachypnea noted. No respiratory distress. He has wheezes. He exhibits no tenderness.  Abdominal: Soft. Normal appearance and bowel sounds are normal. There is no hepatosplenomegaly. There is no tenderness. There is no rebound, no guarding, no tenderness at McBurney's point and negative Murphy's sign. No hernia.  Musculoskeletal: Normal range of motion.  Neurological: He is alert and oriented to person, place, and time. He has normal strength. No cranial nerve deficit or sensory deficit. Coordination normal. GCS eye subscore is 4. GCS verbal subscore is 5. GCS motor subscore is 6.  Skin: Skin is warm, dry and  intact. No rash noted. No cyanosis.  Psychiatric: He has a normal mood and affect. His speech is normal and behavior is normal. Thought content normal.    ED Course  Procedures (including critical care time) Labs Review Labs Reviewed  CBC WITH DIFFERENTIAL  BASIC METABOLIC PANEL   Imaging Review Dg Chest 2 View  11/19/2013   CLINICAL DATA:  Fever, cough  EXAM: CHEST  2 VIEW  COMPARISON:  07/28/2013  FINDINGS: Cardiomediastinal silhouette is stable. No acute infiltrate or pleural effusion. No pulmonary edema. Mild perihilar bronchitic changes.  IMPRESSION: No acute infiltrate or pulmonary edema. Mild perihilar bronchitic changes.   Electronically Signed   By: Natasha MeadLiviu  Pop M.D.   On: 11/19/2013 13:44    EKG Interpretation   None       MDM  Diagnosis: Status asthmaticus  Patient presents to the ER for evaluation of difficulty breathing. Patient had onset of cough and congestion yesterday. Tonight he has worsened with increased cough, shortness of breath, wheezing and fever. Chest x-ray does not show evidence of pneumonia. Patient has had multiple nebulizers, Solu-Medrol, magnesium with improvement of the bronchospasm but was still hypoxic. Upon my recheck sats were 87-88% on supplemental oxygen while at rest in bed. Patient will therefore require hospitalization for further management. Patient was empirically given Tamiflu because of multiple positive sick contacts that possibly have the flu.    Gilda Creasehristopher J. Pollina, MD 11/19/13 [phone]

## 2013-11-19 NOTE — ED Notes (Signed)
Patient transported to X-ray 

## 2013-11-19 NOTE — ED Notes (Addendum)
Pt reports all of family had flu last week, today began to have fever, chills, cough, shortness of breath. Pt having expiratory wheezing. Breathing tx ordered.

## 2013-11-19 NOTE — H&P (Addendum)
[facility] Admission History and Physical  Patient name: Jacob Vaughan Medical record number: [id number] Date of birth: 06/07/1981 Age: 33 y.o. Gender: male  Primary Care Provider: Jeanann LewandowskyJEGEDE, OLUGBEMIGA, MD  Chief Complaint: flu like symptoms, asthma exacerbation.   History of Present Illness:This is a 33 y.o. year old male with prior history of severe persistent asthma on nightly O2 (2.5 L @ night), tobacco abuse presenting with flu like sxs and asthma flare. Pt states that he woke up yesterday with generalized malaise, fever, chills that progressed over the course of the day. Started having persistent dyspnea and wheezing despite hourly albuterol. Pt denies daily inhaled corticosteroid use. Noted flu shot that with last admission 07/2013 for PNA, acute resp failure and asthma exacerbation.  Pt presented to the ER today in mild resp distress with O2 sats < 90% per report. CXR obtained negative for PNA but with mild bronchitic changes. Duoneb x 2, CAT x 1 as well as Solumedrol and magnesium 2g IV x1. ABG with pH 7.51, CO2 25.9, bicarb 20.9, pO2 74. Pt had some improvement in sxs s/p steroids, still with relative hypoxia and need for supplemental O2.   Patient Active Problem List   Diagnosis Date Noted  . Acute respiratory failure with hypoxia 07/30/2013  . Acute respiratory failure 07/25/2013  . Asthma exacerbation 07/25/2013  . PNA (pneumonia) 07/25/2013  . Tobacco abuse 07/25/2013   Past Medical History: Past Medical History  Diagnosis Date  . Asthma     Past Surgical History: History reviewed. No pertinent past surgical history.  Social History: History   Social History  . Marital Status: Single    Spouse Name: N/A    Number of Children: N/A  . Years of Education: N/A   Social History Main Topics  . Smoking status: Former Smoker    Quit date: 08/04/2013  . Smokeless tobacco: None  . Alcohol Use: Yes     Comment: occ  . Drug Use: Yes    Special: Marijuana  . Sexual  Activity: None   Other Topics Concern  . None   Social History Narrative  . None    Family History: No family history on file.  Allergies: No Known Allergies  Current Facility-Administered Medications  Medication Dose Route Frequency Provider Last Rate Last Dose  . 0.9 %  sodium chloride infusion   Intravenous Continuous Jacob AlbeeSteven Bryahna Lesko, MD      . albuterol (PROVENTIL) (2.5 MG/3ML) 0.083% nebulizer solution 2.5 mg  2.5 mg Nebulization Q4H Jacob AlbeeSteven Nastasha Reising, MD      . albuterol (PROVENTIL) (2.5 MG/3ML) 0.083% nebulizer solution 2.5 mg  2.5 mg Nebulization Q2H PRN Jacob AlbeeSteven Arretta Toenjes, MD      . albuterol (PROVENTIL,VENTOLIN) solution continuous neb  10 mg/hr Nebulization Continuous Jacob Creasehristopher J. Pollina, MD   10 mg/hr at 11/19/13 1644  . heparin injection 5,000 Units  5,000 Units Subcutaneous Q8H Jacob AlbeeSteven Ivery Nanney, MD      . ipratropium (ATROVENT) nebulizer solution 0.5 mg  0.5 mg Nebulization Q4H Jacob AlbeeSteven Tajai Suder, MD      . ipratropium (ATROVENT) nebulizer solution 0.5 mg  0.5 mg Nebulization Q2H PRN Jacob AlbeeSteven Toby Ayad, MD      . methylPREDNISolone sodium succinate (SOLU-MEDROL) 125 mg/2 mL injection 125 mg  125 mg Intravenous Q8H Jacob AlbeeSteven Bennette Hasty, MD      . oseltamivir (TAMIFLU) capsule 75 mg  75 mg Oral BID Jacob AlbeeSteven Jadi Deyarmin, MD       Current Outpatient Prescriptions  Medication Sig Dispense Refill  . albuterol (PROVENTIL) (2.5 MG/3ML) 0.083%  nebulizer solution Take 3 mLs (2.5 mg total) by nebulization every 6 (six) hours as needed for wheezing.  150 mL  11  . albuterol-ipratropium (COMBIVENT) 18-103 MCG/ACT inhaler Inhale 2 puffs into the lungs every 6 (six) hours as needed for wheezing or shortness of breath.       Review Of Systems: 12 point ROS negative except as noted above in HPI.  Physical Exam: Filed Vitals:   11/19/13 1900  BP: 105/57  Pulse: 115  Temp:   Resp:     General: alert, cooperative and mildly tachypneic  HEENT: PERRLA and extra ocular movement intact Heart: S1, S2 normal, no  murmur, rub or gallop, regular rate and rhythm Lungs: expiratory wheezes and minimally mildly labored breathing  Abdomen: abdomen is soft without significant tenderness, masses, organomegaly or guarding Extremities: extremities normal, atraumatic, no cyanosis or edema Skin:no rashes, no ecchymoses Neurology: normal without focal findings  Labs and Imaging: Lab Results  Component Value Date/Time   NA 132* 11/19/2013  3:52 PM   K 3.7 11/19/2013  3:52 PM   CL 95* 11/19/2013  3:52 PM   CO2 23 11/19/2013  3:52 PM   BUN 15 11/19/2013  3:52 PM   CREATININE 1.09 11/19/2013  3:52 PM   GLUCOSE 102* 11/19/2013  3:52 PM   Lab Results  Component Value Date   WBC 5.8 11/19/2013   HGB 14.4 11/19/2013   HCT 41.7 11/19/2013   MCV 85.6 11/19/2013   PLT 218 11/19/2013   ABG    Component Value Date/Time   PHART 7.514* 11/19/2013 1655   PCO2ART 25.9* 11/19/2013 1655   PO2ART 74.0* 11/19/2013 1655   HCO3 20.9 11/19/2013 1655   TCO2 22 11/19/2013 1655   ACIDBASEDEF 1.0 11/19/2013 1655   O2SAT 96.0 11/19/2013 1655     Dg Chest 2 View  11/19/2013   CLINICAL DATA:  Fever, cough  EXAM: CHEST  2 VIEW  COMPARISON:  07/28/2013  FINDINGS: Cardiomediastinal silhouette is stable. No acute infiltrate or pleural effusion. No pulmonary edema. Mild perihilar bronchitic changes.  IMPRESSION: No acute infiltrate or pulmonary edema. Mild perihilar bronchitic changes.   Electronically Signed   By: Jacob Vaughan M.D.   On: 11/19/2013 13:44     Assessment and Plan: Jacob Vaughan is a 33 y.o. year old male presenting with asthma exacerbation and flu like sxs.    Asthma Exacerbation/Flu:  Asthma exacerbation likely secondary to flu like illness in setting of recalcitrant tobacco abuse.  Solumedrol 125mg  IVq8 hours.  Albuterol/atrovent nebs q4q2 prn  Tamiflu Resp virus panel  Smoking cessation consult. Discussed this at length with pt.  Supplemental O2 prn.     FEN/GI: ADAT.  Prophylaxis: subq heparin  Disposition: pending  further evaluation  Code Status:full code        Jacob Albee MD  Pager: [phone]

## 2013-11-20 DIAGNOSIS — J09X2 Influenza due to identified novel influenza A virus with other respiratory manifestations: Secondary | ICD-10-CM

## 2013-11-20 DIAGNOSIS — J45901 Unspecified asthma with (acute) exacerbation: Principal | ICD-10-CM

## 2013-11-20 DIAGNOSIS — F17200 Nicotine dependence, unspecified, uncomplicated: Secondary | ICD-10-CM

## 2013-11-20 MED ORDER — PREDNISONE 20 MG PO TABS: ORAL_TABLET | ORAL | Status: DC

## 2013-11-20 MED ORDER — HYDROCOD POLST-CHLORPHEN POLST 10-8 MG/5ML PO LQCR: 5.0000 mL | Freq: Two times a day (BID) | ORAL | Status: DC | PRN

## 2013-11-20 MED ORDER — PREDNISONE 50 MG PO TABS
60.0000 mg | ORAL_TABLET | Freq: Every day | ORAL | Status: DC
  Filled 2013-11-20: qty 1

## 2013-11-20 MED ORDER — GUAIFENESIN ER 600 MG PO TB12
600.0000 mg | ORAL_TABLET | Freq: Two times a day (BID) | ORAL | Status: DC
Start: 2013-11-20 — End: 2014-01-30

## 2013-11-20 MED ORDER — ALBUTEROL SULFATE (2.5 MG/3ML) 0.083% IN NEBU: 2.5000 mg | INHALATION_SOLUTION | RESPIRATORY_TRACT | Status: DC | PRN

## 2013-11-20 MED ORDER — IPRATROPIUM-ALBUTEROL 0.5-2.5 (3) MG/3ML IN SOLN
3.0000 mL | Freq: Four times a day (QID) | RESPIRATORY_TRACT | Status: DC
  Filled 2013-11-20: qty 3

## 2013-11-20 MED ORDER — CHLORHEXIDINE GLUCONATE 0.12 % MT SOLN
15.0000 mL | Freq: Two times a day (BID) | OROMUCOSAL | Status: DC
  Administered 2013-11-20: 15 mL via OROMUCOSAL
  Filled 2013-11-20 (×3): qty 15

## 2013-11-20 MED ORDER — BIOTENE DRY MOUTH MT LIQD: 15.0000 mL | Freq: Two times a day (BID) | OROMUCOSAL | Status: DC

## 2013-11-20 MED ORDER — METHYLPREDNISOLONE SODIUM SUCC 125 MG IJ SOLR
60.0000 mg | Freq: Three times a day (TID) | INTRAMUSCULAR | Status: DC
  Filled 2013-11-20 (×3): qty 0.96

## 2013-11-20 MED ORDER — GUAIFENESIN ER 600 MG PO TB12
600.0000 mg | ORAL_TABLET | Freq: Two times a day (BID) | ORAL | Status: DC
  Administered 2013-11-20: 10:00:00 600 mg via ORAL
  Filled 2013-11-20 (×2): qty 1

## 2013-11-20 MED ORDER — MOMETASONE FURO-FORMOTEROL FUM 100-5 MCG/ACT IN AERO
2.0000 | INHALATION_SPRAY | Freq: Two times a day (BID) | RESPIRATORY_TRACT | Status: DC
  Filled 2013-11-20: qty 8.8

## 2013-11-20 MED ORDER — OSELTAMIVIR PHOSPHATE 75 MG PO CAPS: 75.0000 mg | ORAL_CAPSULE | Freq: Two times a day (BID) | ORAL | Status: DC

## 2013-11-20 MED ORDER — MOMETASONE FURO-FORMOTEROL FUM 100-5 MCG/ACT IN AERO: 2.0000 | INHALATION_SPRAY | Freq: Two times a day (BID) | RESPIRATORY_TRACT | Status: DC

## 2013-11-20 NOTE — Discharge Summary (Signed)
PATIENT DETAILS Name: Jacob Vaughan Age: 33 y.o. Sex: male Date of Birth: 1981/07/27 MRN: [id number]. Admit Date: 11/19/2013 Admitting Physician: Doree Albee, MD PCP:No PCP Per Patient  Recommendations for Outpatient Follow-up:  1. Please refer to pulmonology for PFTs and further workup  PRIMARY DISCHARGE DIAGNOSIS:  Active Problems:   Asthma exacerbation   Influenza      PAST MEDICAL HISTORY: Past Medical History  Diagnosis Date  . Asthma   . Exertional shortness of breath     "recently" (11/19/2013)  . On home oxygen therapy     "2L when I sleep at night" (11/19/2013)  . Pneumonia 07/2013    DISCHARGE MEDICATIONS:   Medication List    STOP taking these medications       albuterol-ipratropium 18-103 MCG/ACT inhaler  Commonly known as:  COMBIVENT      TAKE these medications       albuterol (2.5 MG/3ML) 0.083% nebulizer solution  Commonly known as:  PROVENTIL  Take 3 mLs (2.5 mg total) by nebulization every 4 (four) hours as needed for wheezing.     chlorpheniramine-HYDROcodone 10-8 MG/5ML Lqcr  Commonly known as:  TUSSIONEX  Take 5 mLs by mouth every 12 (twelve) hours as needed for cough.     guaiFENesin 600 MG 12 hr tablet  Commonly known as:  MUCINEX  Take 1 tablet (600 mg total) by mouth 2 (two) times daily.     mometasone-formoterol 100-5 MCG/ACT Aero  Commonly known as:  DULERA  Inhale 2 puffs into the lungs 2 (two) times daily.     oseltamivir 75 MG capsule  Commonly known as:  TAMIFLU  Take 1 capsule (75 mg total) by mouth 2 (two) times daily.     predniSONE 20 MG tablet  Commonly known as:  DELTASONE  - Take 3 tablets (60 mg) daily for 2 days, then,  - Take 2 tablets (40 mg) daily for 2 days, then,  - Take 1 tablets (60 mg) daily for 2 days, then,  - Take 0.5 tablets (10 mg) daily for 1 day and then stop        ALLERGIES:  No Known Allergies  BRIEF HPI:  See H&P, Labs, Consult and Test reports for all details in brief, patient   13-year-old male with a history of severe persistent asthma on nocturnal oxygen, ongoing tobacco abuse presented with flulike symptoms and asthma exacerbation. Patient was then admitted for further evaluation and treatment.  CONSULTATIONS:   None  PERTINENT RADIOLOGIC STUDIES: Dg Chest 2 View  11/19/2013   CLINICAL DATA:  Fever, cough  EXAM: CHEST  2 VIEW  COMPARISON:  07/28/2013  FINDINGS: Cardiomediastinal silhouette is stable. No acute infiltrate or pleural effusion. No pulmonary edema. Mild perihilar bronchitic changes.  IMPRESSION: No acute infiltrate or pulmonary edema. Mild perihilar bronchitic changes.   Electronically Signed   By: Natasha Mead M.D.   On: 11/19/2013 13:44     PERTINENT LAB RESULTS: CBC:  Recent Labs  11/19/13 1552  WBC 5.8  HGB 14.4  HCT 41.7  PLT 218   CMET CMP     Component Value Date/Time   NA 132* 11/19/2013 1552   K 3.7 11/19/2013 1552   CL 95* 11/19/2013 1552   CO2 23 11/19/2013 1552   GLUCOSE 102* 11/19/2013 1552   BUN 15 11/19/2013 1552   CREATININE 1.09 11/19/2013 1552   CALCIUM 8.8 11/19/2013 1552   GFRNONAA 88* 11/19/2013 1552   GFRAA >90 11/19/2013 1552    GFR  Estimated Creatinine Clearance: 105.8 ml/min (by C-G formula based on Cr of 1.09). No results found for this basename: LIPASE, AMYLASE,  in the last 72 hours No results found for this basename: CKTOTAL, CKMB, CKMBINDEX, TROPONINI,  in the last 72 hours No components found with this basename: POCBNP,  No results found for this basename: DDIMER,  in the last 72 hours No results found for this basename: HGBA1C,  in the last 72 hours No results found for this basename: CHOL, HDL, LDLCALC, TRIG, CHOLHDL, LDLDIRECT,  in the last 72 hours No results found for this basename: TSH, T4TOTAL, FREET3, T3FREE, THYROIDAB,  in the last 72 hours No results found for this basename: VITAMINB12, FOLATE, FERRITIN, TIBC, IRON, RETICCTPCT,  in the last 72 hours Coags: No results found for this basename: PT,  INR,  in the last 72 hours Microbiology: No results found for this or any previous visit (from the past 240 hour(s)).   BRIEF HOSPITAL COURSE:   Active Problems:   Asthma exacerbation - Admitted and started on scheduled nebulized bronchodilators, Solu-Medrol. Significant and rapid clinical improvement since admission. I have seen this patient twice today, lungs continue to sound completely clear, he has continued to improve rapidly. Suspect the patient can be discharged later today with tapering steroids, he already has a nebulizer machine at home, suspect he could continue scheduled nebulized bronchodilators at home. He has been counseled extensively about the importance of stopping tobacco abuse. He also will need a referral to pulmonology in the outpatient setting. - This is improved rapidly, patient is being discharged home in a stable condition.  Chronic respiratory failure - He claims that since his last discharge he has been using oxygen intermittently but mostly during the nighttime. - Not sure whether he has severe persistent asthma, however he would benefit from an outpatient referral to pulmonology. We'll defer this to his primary care practitioner in the outpatient setting  Influenza - Patient PCR was positive for influenza A. His symptoms only began 2 days prior to admission, continue Tamiflu on discharge.  Tobacco abuse  - Counseled extensively.  TODAY-DAY OF DISCHARGE:  Subjective:   Jacob Vaughan today has no headache,no chest abdominal pain,no new weakness tingling or numbness, feels much better wants to go home today.  Objective:   Blood pressure 127/65, pulse 100, temperature 98.4 F (36.9 C), temperature source Oral, resp. rate 20, height 6' (1.829 m), weight 93.1 kg (205 lb 4 oz), SpO2 94.00%.  Intake/Output Summary (Last 24 hours) at 11/20/13 1421 Last data filed at 11/20/13 0648  Gross per 24 hour  Intake 848.33 ml  Output      2 ml  Net 846.33 ml   Filed  Weights   11/19/13 1306 11/19/13 2309  Weight: 94.348 kg (208 lb) 93.1 kg (205 lb 4 oz)    Exam Awake Alert, Oriented *3, No new F.N deficits, Normal affect [place].AT,PERRAL Supple Neck,No JVD, No cervical lymphadenopathy appriciated.  Symmetrical Chest wall movement, Good air movement bilaterally, CTAB RRR,No Gallops,Rubs or new Murmurs, No Parasternal Heave +ve B.Sounds, Abd Soft, Non tender, No organomegaly appriciated, No rebound -guarding or rigidity. No Cyanosis, Clubbing or edema, No new Rash or bruise  DISCHARGE CONDITION: Stable  DISPOSITION: Home   DISCHARGE INSTRUCTIONS:    Activity:  As tolerated   Diet recommendation: Regular Diet       Discharge Orders   Future Appointments Provider Department Dept Phone   12/16/2013 4:00 PM [facility]  [phone]   12/16/2013 4:30 PM Jeanann Lewandowsky, MD [facility] [phone]   Future Orders Complete By Expires   Call MD for:  difficulty breathing, headache or visual disturbances  As directed    Diet general  As directed    Increase activity slowly  As directed       Follow-up Information   Follow up with [facility] On 12/16/2013. (4 pm for orange card eligibility and 4:30 for hospital follow up)    Contact information:   [address] [place] [address] [phone]     Total Time spent on discharge equals 45 minutes.  SignedJeoffrey Massed 11/20/2013 2:21 PM

## 2013-11-20 NOTE — Care Management Note (Signed)
    Page 1 of 1   11/20/2013     12:23:19 PM   CARE MANAGEMENT NOTE 11/20/2013  Patient:  Hervey ArdYLER,Tosh LAMAR   Account Number:  [id number]  Date Initiated:  11/20/2013  Documentation initiated by:  Letha CapeAYLOR,Ahaana Rochette  Subjective/Objective Assessment:   dx asthma ex  admit- lives with girlfriend. pta indep.     Action/Plan:   Anticipated DC Date:  11/20/2013   Anticipated DC Plan:  HOME/SELF CARE      DC Planning Services  CM consult  [facility]      Choice offered to / List presented to:             Status of service:  Completed, signed off Medicare Important Message given?   (If response is "NO", the following Medicare IM given date fields will be blank) Date Medicare IM given:   Date Additional Medicare IM given:    Discharge Disposition:  HOME/SELF CARE  Per UR Regulation:  Reviewed for med. necessity/level of care/duration of stay  If discussed at Long Length of Stay Meetings, dates discussed:    Comments:  11/20/13 12:19 Letha Capeeborah Bodee Lafoe RN, BSN [phone] [id number] patient is for dc today, NCM [facility], patient is to go to there pharmacy today before 4 pm to pick up his tamaflu, he will not have to have funds for this since he has an apt scheduled.  MD will order inhalers for patient before dc.

## 2013-11-20 NOTE — Progress Notes (Signed)
Pt admitted to unit from ED. Pt A&O, VS stable, & skin intact. Pt oriented to unit, call bell within reach, and safety video viewed. Pt currently resting comfortably in bed. Will continue to monitor.

## 2013-11-20 NOTE — Progress Notes (Signed)
NURSING PROGRESS NOTE  Jacob Vaughan [id number] Discharge Data: 11/20/2013 3:21 PM Attending Provider: Maretta BeesShanker M Ghimire, MD PCP:No PCP Per Patient     Jacob Vaughan to be D/C'd Home per MD order.  Discussed with the patient the After Visit Summary and all questions fully answered. All IV's discontinued with no bleeding noted. All belongings returned to patient for patient to take home.   Last Vital Signs:  Blood pressure 127/65, pulse 100, temperature 98.4 F (36.9 C), temperature source Oral, resp. rate 20, height 6' (1.829 m), weight 93.1 kg (205 lb 4 oz), SpO2 94.00%.  Discharge Medication List   Medication List    STOP taking these medications       albuterol-ipratropium 18-103 MCG/ACT inhaler  Commonly known as:  COMBIVENT      TAKE these medications       albuterol (2.5 MG/3ML) 0.083% nebulizer solution  Commonly known as:  PROVENTIL  Take 3 mLs (2.5 mg total) by nebulization every 4 (four) hours as needed for wheezing.     chlorpheniramine-HYDROcodone 10-8 MG/5ML Lqcr  Commonly known as:  TUSSIONEX  Take 5 mLs by mouth every 12 (twelve) hours as needed for cough.     guaiFENesin 600 MG 12 hr tablet  Commonly known as:  MUCINEX  Take 1 tablet (600 mg total) by mouth 2 (two) times daily.     mometasone-formoterol 100-5 MCG/ACT Aero  Commonly known as:  DULERA  Inhale 2 puffs into the lungs 2 (two) times daily.     oseltamivir 75 MG capsule  Commonly known as:  TAMIFLU  Take 1 capsule (75 mg total) by mouth 2 (two) times daily.     predniSONE 20 MG tablet  Commonly known as:  DELTASONE  - Take 3 tablets (60 mg) daily for 2 days, then,  - Take 2 tablets (40 mg) daily for 2 days, then,  - Take 1 tablets (60 mg) daily for 2 days, then,  - Take 0.5 tablets (10 mg) daily for 1 day and then stop

## 2013-11-20 NOTE — Progress Notes (Signed)
Patient discharge instructions given  Along with prescriptions and patient verbalized understanding. No needs or concerns with discharge. Iv removed

## 2013-11-20 NOTE — Progress Notes (Signed)
PATIENT DETAILS Name: Jacob Vaughan Age: 33 y.o. Sex: male Date of Birth: 09/23/1981 Admit Date: 11/19/2013 Admitting Physician Doree AlbeeSteven Newton, MD PCP:No PCP Per Patient  Subjective: Significant improvement. Feeling much better, only complaint is cough  Assessment/Plan: Active Problems:   Asthma exacerbation - Rapid improvement since admission. Decrease Solu-Medrol, continue nebulized bronchodilators, continue Tamiflu, reevaluate later this afternoon, if continues to do well possible discharge later today - Note- lungs completely clear on exam without any rhonchi  History of chronic respiratory failure - Apparently has a history of severe persistent asthma, and is on oxygen nocturnally.  Influenza - [name] began only 2 days prior to admission, continue Tamiflu.  Tobacco abuse - Counseled extensively.  Disposition: Remain inpatient  DVT Prophylaxis: Prophylactic  Heparin   Code Status: Full code   Family Communication None at bedside  Procedures:  None  CONSULTS:  None  Time spent 40 minutes-which includes 50% of the time with face-to-face with patient/ family and coordinating care related to the above assessment and plan.    MEDICATIONS: Scheduled Meds: . antiseptic oral rinse  15 mL Mouth Rinse q12n4p  . chlorhexidine  15 mL Mouth Rinse BID  . guaiFENesin  600 mg Oral BID  . heparin  5,000 Units Subcutaneous Q8H  . ipratropium-albuterol  3 mL Nebulization Q6H  . methylPREDNISolone (SOLU-MEDROL) injection  60 mg Intravenous Q8H  . oseltamivir  75 mg Oral BID   Continuous Infusions: . sodium chloride 800 mL (11/20/13 0946)  . albuterol Stopped (11/19/13 1753)   PRN Meds:.albuterol, chlorpheniramine-HYDROcodone  Antibiotics: Anti-infectives   Start     Dose/Rate Route Frequency Ordered Stop   11/19/13 2200  oseltamivir (TAMIFLU) capsule 75 mg     75 mg Oral 2 times daily 11/19/13 1914 11/24/13 2159   11/19/13 1645  oseltamivir (TAMIFLU)  capsule 75 mg     75 mg Oral  Once 11/19/13 1636 11/19/13 1750       PHYSICAL EXAM: Vital signs in last 24 hours: Filed Vitals:   11/19/13 2309 11/20/13 0114 11/20/13 0430 11/20/13 0456  BP: 110/65  106/65   Pulse: 106 98 95 97  Temp: 98.2 F (36.8 C)  98.4 F (36.9 C)   TempSrc: Oral  Oral   Resp: 18 18 18 20   Height: 6' (1.829 m)     Weight: 93.1 kg (205 lb 4 oz)     SpO2: 93% 94% 93% 93%    Weight change:  Filed Weights   11/19/13 1306 11/19/13 2309  Weight: 94.348 kg (208 lb) 93.1 kg (205 lb 4 oz)   Body mass index is 27.83 kg/(m^2).   Gen Exam: Awake and alert with clear speech.   Neck: Supple, No JVD.   Chest: B/L Clear.   CVS: S1 S2 Regular, no murmurs.  Abdomen: soft, BS +, non tender, non distended.  Extremities: no edema, lower extremities warm to touch. Neurologic: Non Focal.   Skin: No Rash.   Wounds: N/A.    Intake/Output from previous day:  Intake/Output Summary (Last 24 hours) at 11/20/13 1033 Last data filed at 11/20/13 0648  Gross per 24 hour  Intake 848.33 ml  Output      2 ml  Net 846.33 ml     LAB RESULTS: CBC  Recent Labs Lab 11/19/13 1552  WBC 5.8  HGB 14.4  HCT 41.7  PLT 218  MCV 85.6  MCH 29.6  MCHC 34.5  RDW 14.0  LYMPHSABS 1.3  MONOABS 0.5  EOSABS 0.0  BASOSABS 0.0    Chemistries   Recent Labs Lab 11/19/13 1552  NA 132*  K 3.7  CL 95*  CO2 23  GLUCOSE 102*  BUN 15  CREATININE 1.09  CALCIUM 8.8    CBG: No results found for this basename: GLUCAP,  in the last 168 hours  GFR Estimated Creatinine Clearance: 105.8 ml/min (by C-G formula based on Cr of 1.09).  Coagulation profile No results found for this basename: INR, PROTIME,  in the last 168 hours  Cardiac Enzymes No results found for this basename: CK, CKMB, TROPONINI, MYOGLOBIN,  in the last 168 hours  No components found with this basename: POCBNP,  No results found for this basename: DDIMER,  in the last 72 hours No results found for this  basename: HGBA1C,  in the last 72 hours No results found for this basename: CHOL, HDL, LDLCALC, TRIG, CHOLHDL, LDLDIRECT,  in the last 72 hours No results found for this basename: TSH, T4TOTAL, FREET3, T3FREE, THYROIDAB,  in the last 72 hours No results found for this basename: VITAMINB12, FOLATE, FERRITIN, TIBC, IRON, RETICCTPCT,  in the last 72 hours No results found for this basename: LIPASE, AMYLASE,  in the last 72 hours  Urine Studies No results found for this basename: UACOL, UAPR, USPG, UPH, UTP, UGL, UKET, UBIL, UHGB, UNIT, UROB, ULEU, UEPI, UWBC, URBC, UBAC, CAST, CRYS, UCOM, BILUA,  in the last 72 hours  MICROBIOLOGY: No results found for this or any previous visit (from the past 240 hour(s)).  RADIOLOGY STUDIES/RESULTS: Dg Chest 2 View  11/19/2013   CLINICAL DATA:  Fever, cough  EXAM: CHEST  2 VIEW  COMPARISON:  07/28/2013  FINDINGS: Cardiomediastinal silhouette is stable. No acute infiltrate or pleural effusion. No pulmonary edema. Mild perihilar bronchitic changes.  IMPRESSION: No acute infiltrate or pulmonary edema. Mild perihilar bronchitic changes.   Electronically Signed   By: Natasha Mead M.D.   On: 11/19/2013 13:44    Jeoffrey Massed, MD  [facility] Pager:[phone]  If 7PM-7AM, please contact night-coverage [email or web address] Password [email or web address] 11/20/2013, 10:33 AM   LOS: 1 day

## 2013-12-16 ENCOUNTER — Inpatient Hospital Stay: Payer: Self-pay | Admitting: Internal Medicine

## 2013-12-16 ENCOUNTER — Ambulatory Visit: Payer: Self-pay

## 2014-01-30 ENCOUNTER — Emergency Department (HOSPITAL_COMMUNITY)
Admission: EM | Admit: 2014-01-30 | Discharge: 2014-01-30 | Disposition: A | Discharge status: Home / Self Care | Payer: BC Managed Care – PPO | Source: Home / Self Care | Attending: Family Medicine | Admitting: Family Medicine

## 2014-01-30 ENCOUNTER — Encounter (HOSPITAL_COMMUNITY): Payer: Self-pay | Admitting: Emergency Medicine

## 2014-01-30 DIAGNOSIS — J45909 Unspecified asthma, uncomplicated: Secondary | ICD-10-CM

## 2014-01-30 MED ORDER — PREDNISONE 50 MG PO TABS: 50.0000 mg | ORAL_TABLET | Freq: Every day | ORAL | Status: DC

## 2014-01-30 MED ORDER — MOMETASONE FURO-FORMOTEROL FUM 100-5 MCG/ACT IN AERO: 2.0000 | INHALATION_SPRAY | Freq: Two times a day (BID) | RESPIRATORY_TRACT | Status: DC

## 2014-01-30 MED ORDER — ALBUTEROL SULFATE HFA 108 (90 BASE) MCG/ACT IN AERS: 2.0000 | INHALATION_SPRAY | Freq: Four times a day (QID) | RESPIRATORY_TRACT | Status: DC | PRN

## 2014-01-30 NOTE — ED Provider Notes (Signed)
Jacob Vaughan is a 33 y.o. male who presents to Urgent Care today for asthma. Patient is running out of his Dulera asthma inhaler. He is using it as needed and not twice daily. Uses it multiple times a day. He notes some wheezing but feels well otherwise. No significant shortness of breath.    Past Medical History  Diagnosis Date  . Asthma   . Exertional shortness of breath     "recently" (11/19/2013)  . On home oxygen therapy     "2L when I sleep at night" (11/19/2013)  . Pneumonia 07/2013   History  Substance Use Topics  . Smoking status: Current Every Day Smoker -- 0.12 packs/day for 18 years    Types: Cigarettes  . Smokeless tobacco: Never Used  . Alcohol Use: Yes     Comment: 11/19/2013 "1 beer/month; drink liquor on holidays"   ROS as above Medications: No current facility-administered medications for this encounter.   Current Outpatient Prescriptions  Medication Sig Dispense Refill  . albuterol (PROVENTIL HFA;VENTOLIN HFA) 108 (90 BASE) MCG/ACT inhaler Inhale 2 puffs into the lungs every 6 (six) hours as needed for wheezing or shortness of breath.  1 Inhaler  2  . mometasone-formoterol (DULERA) 100-5 MCG/ACT AERO Inhale 2 puffs into the lungs 2 (two) times daily.  8.8 g  1  . predniSONE (DELTASONE) 50 MG tablet Take 1 tablet (50 mg total) by mouth daily.  5 tablet  0    Exam:  BP 122/62  Pulse 98  Temp(Src) 98.3 F (36.8 C) (Oral)  Resp 20  SpO2 97% Gen: Well NAD HEENT: EOMI,  MMM Lungs: Normal work of breathing. Coarse breath sounds present bilaterally Heart: RRR no MRG Abd: NABS, Soft. NT, ND Exts: Brisk capillary refill, warm and well perfused.   No results found for this or any previous visit (from the past 24 hour(s)). No results found.  Assessment and Plan: 33 y.o. male with asthma. Refilled Allegra and albuterol. Prescribe short course of prednisone. Followup with primary care provider  Discussed warning signs or symptoms. Please see discharge  instructions. Patient expresses understanding.    Rodolph BongEvan S Oreoluwa Aigner, MD 01/30/14 [phone]

## 2014-01-30 NOTE — Discharge Instructions (Signed)
Thank you for coming in today. USe the dulara twice daily for asthma prevention.  Use albuterol for wheezing as needed.  Take prednisone daily for 5 days.  Call or go to the emergency room if you get worse, have trouble breathing, have chest pains, or palpitations.   Asthma, Adult Asthma is a condition of the lungs in which the airways tighten and narrow. Asthma can make it hard to breathe. Asthma cannot be cured, but medicine and lifestyle changes can help control it. Asthma may be started (triggered) by:  Animal skin flakes (dander).  Dust.  Cockroaches.  Pollen.  Mold.  Smoke.  Cleaning products.  Hair sprays or aerosol sprays.  Paint fumes or strong smells.  Cold air, weather changes, and winds.  Crying or laughing hard.  Stress.  Certain medicines or drugs.  Foods, such as dried fruit, potato chips, and sparkling grape juice.  Infections or conditions (colds, flu).  Exercise.  Certain medical conditions or diseases.  Exercise or tiring activities. HOME CARE   Take medicine as told by your doctor.  Use a peak flow meter as told by your doctor. A peak flow meter is a tool that measures how well the lungs are working.  Record and keep track of the peak flow meter's readings.  Understand and use the asthma action plan. An asthma action plan is a written plan for taking care of your asthma and treating your attacks.  To help prevent asthma attacks:  Do not smoke. Stay away from secondhand smoke.  Change your heating and air conditioning filter often.  Limit your use of fireplaces and wood stoves.  Get rid of pests (such as roaches and mice) and their droppings.  Throw away plants if you see mold on them.  Clean your floors. Dust regularly. Use cleaning products that do not smell.  Have someone vacuum when you are not home. Use a vacuum cleaner with a HEPA filter if possible.  Replace carpet with wood, tile, or vinyl flooring. Carpet can trap  animal skin flakes and dust.  Use allergy-proof pillows, mattress covers, and box spring covers.  Wash bed sheets and blankets every week in hot water and dry them in a dryer.  Use blankets that are made of polyester or cotton.  Clean bathrooms and kitchens with bleach. If possible, have someone repaint the walls in these rooms with mold-resistant paint. Keep out of the rooms that are being cleaned and painted.  Wash hands often. GET HELP IF:  You have make a whistling sound when breaking (wheeze), have shortness of breath, or have a cough even if taking medicine to prevent attacks.  The colored mucus you cough up (sputum) is thicker than usual.  The colored mucus you cough up changes from clear or white to yellow, green, gray, or bloody.  You have problems from the medicine you are taking such as:  A rash.  Itching.  Swelling.  Trouble breathing.  You need reliever medicines more than 2 3 times a week.  Your peak flow measurement is still at 50 79% of your personal best after following the action plan for 1 hour. GET HELP RIGHT AWAY IF:   You seem to be worse and are not responding to medicine during an asthma attack.  You are short of breath even at rest.  You get short of breath when doing very little activity.  You have trouble eating, drinking, or talking.  You have chest pain.  You have a fast heartbeat.  Your lips or fingernails start to turn blue.  You are lightheaded, dizzy, or faint.  Your peak flow is less than 50% of your personal best.  You have a fever or lasting symptoms for more than 2 3 days.  You have a fever and your symptoms suddenly get worse. MAKE SURE YOU:   Understand these instructions.  Will watch your condition.  Will get help right away if you are not doing well or get worse. Document Released: 04/11/2008 Document Revised: 08/14/2013 Document Reviewed: 05/23/2013 [facility] Patient Information 2014 [employer], [place].

## 2014-01-30 NOTE — ED Notes (Signed)
C/o medication refill States dulera needs to be refilled PCP did not have earlier appt

## 2014-02-25 ENCOUNTER — Ambulatory Visit: Payer: Self-pay | Admitting: Internal Medicine

## 2014-04-09 ENCOUNTER — Encounter (HOSPITAL_COMMUNITY): Payer: Self-pay | Admitting: Emergency Medicine

## 2014-04-09 ENCOUNTER — Emergency Department (INDEPENDENT_AMBULATORY_CARE_PROVIDER_SITE_OTHER): Payer: BC Managed Care – PPO

## 2014-04-09 ENCOUNTER — Emergency Department (HOSPITAL_COMMUNITY)
Admission: EM | Admit: 2014-04-09 | Discharge: 2014-04-09 | Disposition: A | Discharge status: Home / Self Care | Payer: BC Managed Care – PPO | Source: Home / Self Care

## 2014-04-09 DIAGNOSIS — J40 Bronchitis, not specified as acute or chronic: Secondary | ICD-10-CM

## 2014-04-09 DIAGNOSIS — J45909 Unspecified asthma, uncomplicated: Secondary | ICD-10-CM

## 2014-04-09 DIAGNOSIS — F17200 Nicotine dependence, unspecified, uncomplicated: Secondary | ICD-10-CM

## 2014-04-09 DIAGNOSIS — Z720 Tobacco use: Secondary | ICD-10-CM

## 2014-04-09 DIAGNOSIS — J45901 Unspecified asthma with (acute) exacerbation: Secondary | ICD-10-CM

## 2014-04-09 MED ORDER — TRIAMCINOLONE ACETONIDE 40 MG/ML IJ SUSP
INTRAMUSCULAR | Status: AC
  Filled 2014-04-09: qty 1

## 2014-04-09 MED ORDER — IPRATROPIUM BROMIDE 0.02 % IN SOLN
RESPIRATORY_TRACT | Status: AC
  Filled 2014-04-09: qty 2.5

## 2014-04-09 MED ORDER — BECLOMETHASONE DIPROPIONATE 80 MCG/ACT IN AERS: 1.0000 | INHALATION_SPRAY | Freq: Two times a day (BID) | RESPIRATORY_TRACT | Status: DC

## 2014-04-09 MED ORDER — ALBUTEROL SULFATE (2.5 MG/3ML) 0.083% IN NEBU
2.5000 mg | INHALATION_SOLUTION | Freq: Once | RESPIRATORY_TRACT | Status: AC
  Administered 2014-04-09: 2.5 mg via RESPIRATORY_TRACT

## 2014-04-09 MED ORDER — ALBUTEROL SULFATE (2.5 MG/3ML) 0.083% IN NEBU
INHALATION_SOLUTION | RESPIRATORY_TRACT | Status: AC
  Filled 2014-04-09: qty 3

## 2014-04-09 MED ORDER — ALBUTEROL SULFATE HFA 108 (90 BASE) MCG/ACT IN AERS: 2.0000 | INHALATION_SPRAY | RESPIRATORY_TRACT | Status: DC | PRN

## 2014-04-09 MED ORDER — IPRATROPIUM BROMIDE 0.02 % IN SOLN
0.5000 mg | Freq: Once | RESPIRATORY_TRACT | Status: AC
  Administered 2014-04-09: 0.5 mg via RESPIRATORY_TRACT

## 2014-04-09 MED ORDER — DOXYCYCLINE HYCLATE 100 MG PO CAPS: 100.0000 mg | ORAL_CAPSULE | Freq: Two times a day (BID) | ORAL | Status: DC

## 2014-04-09 MED ORDER — ALBUTEROL SULFATE (2.5 MG/3ML) 0.083% IN NEBU
INHALATION_SOLUTION | RESPIRATORY_TRACT | Status: AC
  Filled 2014-04-09: qty 6

## 2014-04-09 MED ORDER — ALBUTEROL SULFATE (2.5 MG/3ML) 0.083% IN NEBU
5.0000 mg | INHALATION_SOLUTION | Freq: Once | RESPIRATORY_TRACT | Status: AC
  Administered 2014-04-09: 5 mg via RESPIRATORY_TRACT

## 2014-04-09 MED ORDER — TRIAMCINOLONE ACETONIDE 40 MG/ML IJ SUSP
60.0000 mg | Freq: Once | INTRAMUSCULAR | Status: AC
  Administered 2014-04-09: 60 mg via INTRAMUSCULAR

## 2014-04-09 MED ORDER — METHYLPREDNISOLONE 4 MG PO KIT: PACK | ORAL | Status: DC

## 2014-04-09 NOTE — ED Provider Notes (Signed)
Medical screening examination/treatment/procedure(s) were performed by non-physician practitioner and as supervising physician I was immediately available for consultation/collaboration.  Leslee Home, M.D.  Reuben Likes, MD 04/09/14 1350

## 2014-04-09 NOTE — ED Notes (Addendum)
Has a report appt to see MD next week for asthma . Sick x 1 week w cough, starting to get sore in chest. C/o out of his MDI x 1 week. Breathing labored, using accessory muscles. Speaking in short sentences. Reports yellow secretions

## 2014-04-09 NOTE — ED Provider Notes (Signed)
CSN: [id number]     Arrival date & time 04/09/14  1101 History   First MD Initiated Contact with Patient 04/09/14 1126     Chief Complaint  Patient presents with  . Asthma   (Consider location/radiation/quality/duration/timing/severity/associated sxs/prior Treatment) HPI Comments: 33 year old male with asthma presents with a two-day history of wheezing, dyspnea and cough. He has a history of pneumonia. He is a current daily smoker to include cigarettes and marijuana. He apparently has been out of his inhaler for several days. He is on home oxygen 2 L per minute while sleeping.   Past Medical History  Diagnosis Date  . Asthma   . Exertional shortness of breath     "recently" (11/19/2013)  . On home oxygen therapy     "2L when I sleep at night" (11/19/2013)  . Pneumonia 07/2013   Past Surgical History  Procedure Laterality Date  . No past surgeries     History reviewed. No pertinent family history. History  Substance Use Topics  . Smoking status: Current Every Day Smoker -- 0.12 packs/day for 18 years    Types: Cigarettes  . Smokeless tobacco: Never Used  . Alcohol Use: Yes     Comment: 11/19/2013 "1 beer/month; drink liquor on holidays"    Review of Systems  Constitutional: Positive for activity change. Negative for fever, diaphoresis and fatigue.  HENT: Negative.   Respiratory: Positive for cough, shortness of breath and wheezing.        Right chest pain with coughing. Copious thick tan sputum.  Gastrointestinal: Negative.   Skin: Positive for rash.    Allergies  Review of patient's allergies indicates no known allergies.  Home Medications   Prior to Admission medications   Medication Sig Start Date End Date Taking? Authorizing Provider  albuterol (PROVENTIL HFA;VENTOLIN HFA) 108 (90 BASE) MCG/ACT inhaler Inhale 2 puffs into the lungs every 6 (six) hours as needed for wheezing or shortness of breath. 01/30/14  Yes Rodolph Bong, MD  mometasone-formoterol (DULERA) 100-5  MCG/ACT AERO Inhale 2 puffs into the lungs 2 (two) times daily. 01/30/14  Yes Rodolph Bong, MD  albuterol (PROVENTIL HFA;VENTOLIN HFA) 108 (90 BASE) MCG/ACT inhaler Inhale 2 puffs into the lungs every 4 (four) hours as needed for wheezing or shortness of breath. 04/09/14   Hayden Rasmussen, NP  beclomethasone (QVAR) 80 MCG/ACT inhaler Inhale 1 puff into the lungs 2 (two) times daily. 04/09/14   Hayden Rasmussen, NP  doxycycline (VIBRAMYCIN) 100 MG capsule Take 1 capsule (100 mg total) by mouth 2 (two) times daily. 04/09/14   Hayden Rasmussen, NP  methylPREDNISolone (MEDROL DOSEPAK) 4 MG tablet follow package directions 04/09/14   Hayden Rasmussen, NP  predniSONE (DELTASONE) 50 MG tablet Take 1 tablet (50 mg total) by mouth daily. 01/30/14   Rodolph Bong, MD   BP 105/55  Pulse 88  Temp(Src) 98.7 F (37.1 C) (Oral)  Resp 20  SpO2 99% Physical Exam  Nursing note and vitals reviewed. Constitutional: He is oriented to person, place, and time. He appears well-developed and well-nourished. He appears distressed.  Eyes: Conjunctivae and EOM are normal.  Neck: Normal range of motion. Neck supple.  Cardiovascular: Normal rate, regular rhythm and normal heart sounds.   Pulmonary/Chest: He has wheezes. He exhibits tenderness.  Upon arrival the nurse reports the patient is having difficulty breathing with wheezing and cough. Examiner was unable to see him immediately and a DuoNeb was ordered.   Musculoskeletal: He exhibits no edema.  Neurological: He is alert  and oriented to person, place, and time. He exhibits normal muscle tone.  Skin: Skin is warm and dry. No rash noted.  Psychiatric: He has a normal mood and affect.    ED Course  Procedures (including critical care time) Labs Review Labs Reviewed - No data to display  Imaging Review Dg Chest 2 View  04/09/2014   CLINICAL DATA:  Cough, congestion, status post breathing treatment  EXAM: CHEST  2 VIEW  COMPARISON:  11/19/2013  FINDINGS: Mild bronchitic changes. No focal  consolidation. No pleural effusion or pneumothorax.  The heart is normal in size.  Visualized osseous structures are within normal limits.  IMPRESSION: No evidence of acute cardiopulmonary disease.   Electronically Signed   By: Charline BillsSriyesh  Krishnan M.D.   On: 04/09/2014 12:21     MDM   1. Asthma with acute exacerbation in adult   2. Bronchitis   3. Tobacco abuse     1150 hours: Post DuoNeb the patient states he is breathing a little better. There is good to fair air movement with moderate full expiratory wheezing bilaterally. The patient continues to cough up brown thick sputum. Obtain a chest x-ray and Mr. Kenalog 60 mg IM. 15 minutes after cessation of the first DuoNeb will administer a second albuterol nebulizer.  1250 hours. Post second albuterol nebulizer treatment patient states he feels even better. Auscultation reveals improved air movement and decrease in wheezing. There continues to be bilateral mild to moderate wheezing. Chest x-ray results as above. Due to patient's history of smoking and amount and color of sputum treat with doxycycline 100 mg twice a day Medrol Dosepak to start tomorrow Robitussin-DM Drink plenty of fluids For worsening return to the emergency department.  Refill albuterol HFA and add Qvar 80 bid Obtain a PCP as soon as possible.   Hayden Rasmussenavid Estephany Perot, NP 04/09/14 1310

## 2014-04-09 NOTE — Discharge Instructions (Signed)
Asthma, Acute Bronchospasm °Acute bronchospasm caused by asthma is also referred to as an asthma attack. Bronchospasm means your air passages become narrowed. The narrowing is caused by inflammation and tightening of the muscles in the air tubes (bronchi) in your lungs. This can make it hard to breath or cause you to wheeze and cough. °CAUSES °Possible triggers are: °· Animal dander from the skin, hair, or feathers of animals. °· Dust mites contained in house dust. °· Cockroaches. °· Pollen from trees or grass. °· Mold. °· Cigarette or tobacco smoke. °· Air pollutants such as dust, household cleaners, hair sprays, aerosol sprays, paint fumes, strong chemicals, or strong odors. °· Cold air or weather changes. Cold air may trigger inflammation. Winds increase molds and pollens in the air. °· Strong emotions such as crying or laughing hard. °· Stress. °· Certain medicines such as aspirin or beta-blockers. °· Sulfites in foods and drinks, such as dried fruits and wine. °· Infections or inflammatory conditions, such as a flu, cold, or inflammation of the nasal membranes (rhinitis). °· Gastroesophageal reflux disease (GERD). GERD is a condition where stomach acid backs up into your throat (esophagus). °· Exercise or strenuous activity. °SIGNS AND SYMPTOMS  °· Wheezing. °· Excessive coughing, particularly at night. °· Chest tightness. °· Shortness of breath. °DIAGNOSIS  °Your health care provider will ask you about your medical history and perform a physical exam. A chest X-ray or blood testing may be performed to look for other causes of your symptoms or other conditions that may have triggered your asthma attack.  °TREATMENT  °Treatment is aimed at reducing inflammation and opening up the airways in your lungs.  Most asthma attacks are treated with inhaled medicines. These include quick relief or rescue medicines (such as bronchodilators) and controller medicines (such as inhaled corticosteroids). These medicines are  sometimes given through an inhaler or a nebulizer. Systemic steroid medicine taken by mouth or given through an IV tube also can be used to reduce the inflammation when an attack is moderate or severe. Antibiotic medicines are only used if a bacterial infection is present.  °HOME CARE INSTRUCTIONS  °· Rest. °· Drink plenty of liquids. This helps the mucus to remain thin and be easily coughed up. Only use caffeine in moderation and do not use alcohol until you have recovered from your illness. °· Do not smoke. Avoid being exposed to secondhand smoke. °· You play a critical role in keeping yourself in good health. Avoid exposure to things that cause you to wheeze or to have breathing problems. °· Keep your medicines up to date and available. Carefully follow your health care provider's treatment plan. °· Take your medicine exactly as prescribed. °· When pollen or pollution is bad, keep windows closed and use an air conditioner or go to places with air conditioning. °· Asthma requires careful medical care. See your health care provider for a follow-up as advised. If you are more than [redacted] weeks pregnant and you were prescribed any new medicines, let your obstetrician know about the visit and how you are doing. Follow-up with your health care provider as directed. °· After you have recovered from your asthma attack, make an appointment with your outpatient doctor to talk about ways to reduce the likelihood of future attacks. If you do not have a doctor who manages your asthma, make an appointment with a primary care doctor to discuss your asthma. °SEEK IMMEDIATE MEDICAL CARE IF:  °· You are getting worse. °· You have trouble breathing. If severe, call   your local emergency services (911 in the [place]  You develop chest pain or discomfort.  You are vomiting.  You are not able to keep fluids down.  You are coughing up yellow, green, brown, or bloody sputum.  You have a fever and your symptoms suddenly get  worse.  You have trouble swallowing. MAKE SURE YOU:   Understand these instructions.  Will watch your condition.  Will get help right away if you are not doing well or get worse. Document Released: 02/08/2007 Document Revised: 06/26/2013 Document Reviewed: 05/01/2013 [facility] Patient Information 2014 [place], [place].  Bronchitis Bronchitis is swelling (inflammation) of the air tubes leading to your lungs (bronchi). This causes mucus and a cough. If the swelling gets bad, you may have trouble breathing. HOME CARE   Rest.  Drink enough fluids to keep your pee (urine) clear or pale yellow (unless you have a condition where you have to watch how much you drink).  Only take medicine as told by your doctor. If you were given antibiotic medicines, finish them even if you start to feel better.  Avoid smoke, irritating chemicals, and strong smells. These make the problem worse. Quit smoking if you smoke. This helps your lungs heal faster.  Use a cool mist humidifier. Change the water in the humidifier every day. You can also sit in the bathroom with hot shower running for 5 10 minutes. Keep the door closed.  See your health care provider as told.  Wash your hands often. GET HELP IF: Your problems do not get better after 1 week. GET HELP RIGHT AWAY IF:   Your fever gets worse.  You have chills.  Your chest hurts.  Your problems breathing get worse.  You have blood in your mucus.  You pass out (faint).  You feel lightheaded.  You have a bad headache.  You throw up (vomit) again and again. MAKE SURE YOU:  Understand these instructions.  Will watch your condition.  Will get help right away if you are not doing well or get worse. Document Released: 04/11/2008 Document Revised: 08/14/2013 Document Reviewed: 06/18/2013 [facility] Patient Information 2014 [place], [place].  Smoking Cessation Quitting smoking is important to your health and has many advantages. However, it is  not always easy to quit since nicotine is a very addictive drug. Often times, people try 3 times or more before being able to quit. This document explains the best ways for you to prepare to quit smoking. Quitting takes hard work and a lot of effort, but you can do it. ADVANTAGES OF QUITTING SMOKING  You will live longer, feel better, and live better.  Your body will feel the impact of quitting smoking almost immediately.  Within 20 minutes, blood pressure decreases. Your pulse returns to its normal level.  After 8 hours, carbon monoxide levels in the blood return to normal. Your oxygen level increases.  After 24 hours, the chance of having a heart attack starts to decrease. Your breath, hair, and body stop smelling like smoke.  After 48 hours, damaged nerve endings begin to recover. Your sense of taste and smell improve.  After 72 hours, the body is virtually free of nicotine. Your bronchial tubes relax and breathing becomes easier.  After 2 to 12 weeks, lungs can hold more air. Exercise becomes easier and circulation improves.  The risk of having a heart attack, stroke, cancer, or lung disease is greatly reduced.  After 1 year, the risk of coronary heart disease is cut in half.  After  5 years, the risk of stroke falls to the same as a nonsmoker.  After 10 years, the risk of lung cancer is cut in half and the risk of other cancers decreases significantly.  After 15 years, the risk of coronary heart disease drops, usually to the level of a nonsmoker.  If you are pregnant, quitting smoking will improve your chances of having a healthy baby.  The people you live with, especially any children, will be healthier.  You will have extra money to spend on things other than cigarettes. QUESTIONS TO THINK ABOUT BEFORE ATTEMPTING TO QUIT You may want to talk about your answers with your caregiver.  Why do you want to quit?  If you tried to quit in the past, what helped and what did  not?  What will be the most difficult situations for you after you quit? How will you plan to handle them?  Who can help you through the tough times? Your family? Friends? A caregiver?  What pleasures do you get from smoking? What ways can you still get pleasure if you quit? Here are some questions to ask your caregiver:  How can you help me to be successful at quitting?  What medicine do you think would be best for me and how should I take it?  What should I do if I need more help?  What is smoking withdrawal like? How can I get information on withdrawal? GET READY  Set a quit date.  Change your environment by getting rid of all cigarettes, ashtrays, matches, and lighters in your home, car, or work. Do not let people smoke in your home.  Review your past attempts to quit. Think about what worked and what did not. GET SUPPORT AND ENCOURAGEMENT You have a better chance of being successful if you have help. You can get support in many ways.  Tell your family, friends, and co-workers that you are going to quit and need their support. Ask them not to smoke around you.  Get individual, group, or telephone counseling and support. Programs are available at [employer] and health centers. Call your local health department for information about programs in your area.  Spiritual beliefs and practices may help some smokers quit.  Download a "quit meter" on your computer to keep track of quit statistics, such as how long you have gone without smoking, cigarettes not smoked, and money saved.  Get a self-help book about quitting smoking and staying off of tobacco. LEARN NEW SKILLS AND BEHAVIORS  Distract yourself from urges to smoke. Talk to someone, go for a walk, or occupy your time with a task.  Change your normal routine. Take a different route to work. Drink tea instead of coffee. Eat breakfast in a different place.  Reduce your stress. Take a hot bath, exercise, or read a  book.  Plan something enjoyable to do every day. Reward yourself for not smoking.  Explore interactive web-based programs that specialize in helping you quit. GET MEDICINE AND USE IT CORRECTLY Medicines can help you stop smoking and decrease the urge to smoke. Combining medicine with the above behavioral methods and support can greatly increase your chances of successfully quitting smoking.  Nicotine replacement therapy helps deliver nicotine to your body without the negative effects and risks of smoking. Nicotine replacement therapy includes nicotine gum, lozenges, inhalers, nasal sprays, and skin patches. Some may be available over-the-counter and others require a prescription.  Antidepressant medicine helps people abstain from smoking, but how this works  is unknown. This medicine is available by prescription.  Nicotinic receptor partial agonist medicine simulates the effect of nicotine in your brain. This medicine is available by prescription. Ask your caregiver for advice about which medicines to use and how to use them based on your health history. Your caregiver will tell you what side effects to look out for if you choose to be on a medicine or therapy. Carefully read the information on the package. Do not use any other product containing nicotine while using a nicotine replacement product.  RELAPSE OR DIFFICULT SITUATIONS Most relapses occur within the first 3 months after quitting. Do not be discouraged if you start smoking again. Remember, most people try several times before finally quitting. You may have symptoms of withdrawal because your body is used to nicotine. You may crave cigarettes, be irritable, feel very hungry, cough often, get headaches, or have difficulty concentrating. The withdrawal symptoms are only temporary. They are strongest when you first quit, but they will go away within 10 14 days. To reduce the chances of relapse, try to:  Avoid drinking alcohol. Drinking lowers  your chances of successfully quitting.  Reduce the amount of caffeine you consume. Once you quit smoking, the amount of caffeine in your body increases and can give you symptoms, such as a rapid heartbeat, sweating, and anxiety.  Avoid smokers because they can make you want to smoke.  Do not let weight gain distract you. Many smokers will gain weight when they quit, usually less than 10 pounds. Eat a healthy diet and stay active. You can always lose the weight gained after you quit.  Find ways to improve your mood other than smoking. FOR MORE INFORMATION  www.smokefree.gov  Document Released: 10/18/2001 Document Revised: 04/24/2012 Document Reviewed: 02/02/2012 [facility] Patient Information 2014 [place]

## 2014-07-17 ENCOUNTER — Inpatient Hospital Stay (HOSPITAL_COMMUNITY)
Admission: EM | Admit: 2014-07-17 | Discharge: 2014-07-19 | DRG: 193 | Disposition: A | Discharge status: Home / Self Care | Payer: BC Managed Care – PPO | Attending: Internal Medicine | Admitting: Internal Medicine

## 2014-07-17 ENCOUNTER — Encounter (HOSPITAL_COMMUNITY): Payer: Self-pay | Admitting: Emergency Medicine

## 2014-07-17 ENCOUNTER — Emergency Department (HOSPITAL_COMMUNITY): Payer: BC Managed Care – PPO

## 2014-07-17 DIAGNOSIS — Z720 Tobacco use: Secondary | ICD-10-CM

## 2014-07-17 DIAGNOSIS — M79604 Pain in right leg: Secondary | ICD-10-CM | POA: Diagnosis present

## 2014-07-17 DIAGNOSIS — Z9981 Dependence on supplemental oxygen: Secondary | ICD-10-CM

## 2014-07-17 DIAGNOSIS — F1210 Cannabis abuse, uncomplicated: Secondary | ICD-10-CM | POA: Diagnosis present

## 2014-07-17 DIAGNOSIS — F17200 Nicotine dependence, unspecified, uncomplicated: Secondary | ICD-10-CM | POA: Diagnosis present

## 2014-07-17 DIAGNOSIS — J189 Pneumonia, unspecified organism: Principal | ICD-10-CM | POA: Diagnosis present

## 2014-07-17 DIAGNOSIS — M25559 Pain in unspecified hip: Secondary | ICD-10-CM | POA: Diagnosis present

## 2014-07-17 DIAGNOSIS — M25569 Pain in unspecified knee: Secondary | ICD-10-CM | POA: Diagnosis present

## 2014-07-17 DIAGNOSIS — IMO0002 Reserved for concepts with insufficient information to code with codable children: Secondary | ICD-10-CM

## 2014-07-17 DIAGNOSIS — J9620 Acute and chronic respiratory failure, unspecified whether with hypoxia or hypercapnia: Secondary | ICD-10-CM | POA: Diagnosis present

## 2014-07-17 DIAGNOSIS — J9600 Acute respiratory failure, unspecified whether with hypoxia or hypercapnia: Secondary | ICD-10-CM | POA: Diagnosis present

## 2014-07-17 DIAGNOSIS — J45901 Unspecified asthma with (acute) exacerbation: Secondary | ICD-10-CM | POA: Diagnosis present

## 2014-07-17 DIAGNOSIS — G8929 Other chronic pain: Secondary | ICD-10-CM | POA: Diagnosis present

## 2014-07-17 DIAGNOSIS — M79609 Pain in unspecified limb: Secondary | ICD-10-CM | POA: Diagnosis present

## 2014-07-17 DIAGNOSIS — Z23 Encounter for immunization: Secondary | ICD-10-CM

## 2014-07-17 DIAGNOSIS — J9601 Acute respiratory failure with hypoxia: Secondary | ICD-10-CM

## 2014-07-17 LAB — BASIC METABOLIC PANEL
Anion gap: 14 (ref 5–15)
BUN: 14 mg/dL (ref 6–23)
CALCIUM: 8.6 mg/dL (ref 8.4–10.5)
CO2: 22 mEq/L (ref 19–32)
CREATININE: 1.08 mg/dL (ref 0.50–1.35)
Chloride: 102 mEq/L (ref 96–112)
GFR, EST NON AFRICAN AMERICAN: 88 mL/min — AB (ref >90)
Glucose, Bld: 105 mg/dL — ABNORMAL HIGH (ref 70–99)
Potassium: 3.4 mEq/L — ABNORMAL LOW (ref 3.7–5.3)
Sodium: 138 mEq/L (ref 137–147)

## 2014-07-17 LAB — CBC WITH DIFFERENTIAL/PLATELET
BASOS ABS: 0 10*3/uL (ref 0.0–0.1)
Basophils Relative: 0 % (ref 0–1)
EOS ABS: 0.1 10*3/uL (ref 0.0–0.7)
Eosinophils Relative: 1 % (ref 0–5)
HCT: 38.3 % — ABNORMAL LOW (ref 39.0–52.0)
Hemoglobin: 13 g/dL (ref 13.0–17.0)
LYMPHS PCT: 15 % (ref 12–46)
Lymphs Abs: 2 10*3/uL (ref 0.7–4.0)
MCH: 29.9 pg (ref 26.0–34.0)
MCHC: 33.9 g/dL (ref 30.0–36.0)
MCV: 88 fL (ref 78.0–100.0)
MONO ABS: 1.7 10*3/uL — AB (ref 0.1–1.0)
Monocytes Relative: 13 % — ABNORMAL HIGH (ref 3–12)
NEUTROS ABS: 9.2 10*3/uL — AB (ref 1.7–7.7)
Neutrophils Relative %: 71 % (ref 43–77)
Platelets: 299 10*3/uL (ref 150–400)
RBC: 4.35 MIL/uL (ref 4.22–5.81)
RDW: 14.4 % (ref 11.5–15.5)
WBC: 13 10*3/uL — ABNORMAL HIGH (ref 4.0–10.5)

## 2014-07-17 LAB — I-STAT CG4 LACTIC ACID, ED: Lactic Acid, Venous: 1.82 mmol/L (ref 0.5–2.2)

## 2014-07-17 LAB — D-DIMER, QUANTITATIVE: D-Dimer, Quant: 0.43 ug/mL-FEU (ref 0.00–0.48)

## 2014-07-17 MED ORDER — IPRATROPIUM BROMIDE 0.02 % IN SOLN
0.5000 mg | Freq: Once | RESPIRATORY_TRACT | Status: AC
  Administered 2014-07-17: 0.5 mg via RESPIRATORY_TRACT
  Filled 2014-07-17: qty 2.5

## 2014-07-17 MED ORDER — ALBUTEROL SULFATE (2.5 MG/3ML) 0.083% IN NEBU
5.0000 mg | INHALATION_SOLUTION | Freq: Once | RESPIRATORY_TRACT | Status: AC
  Administered 2014-07-17: 5 mg via RESPIRATORY_TRACT
  Filled 2014-07-17: qty 6

## 2014-07-17 MED ORDER — ALBUTEROL (5 MG/ML) CONTINUOUS INHALATION SOLN
15.0000 mg/h | INHALATION_SOLUTION | Freq: Once | RESPIRATORY_TRACT | Status: AC
  Administered 2014-07-17: 15 mg/h via RESPIRATORY_TRACT
  Filled 2014-07-17: qty 20

## 2014-07-17 MED ORDER — SODIUM CHLORIDE 0.9 % IV BOLUS (SEPSIS)
1000.0000 mL | Freq: Once | INTRAVENOUS | Status: AC
  Administered 2014-07-17: 1000 mL via INTRAVENOUS

## 2014-07-17 MED ORDER — METHYLPREDNISOLONE SODIUM SUCC 125 MG IJ SOLR
125.0000 mg | Freq: Once | INTRAMUSCULAR | Status: AC
  Administered 2014-07-17: 125 mg via INTRAVENOUS
  Filled 2014-07-17: qty 2

## 2014-07-17 MED ORDER — HYDROCOD POLST-CHLORPHEN POLST 10-8 MG/5ML PO LQCR
5.0000 mL | Freq: Once | ORAL | Status: AC
  Administered 2014-07-17: 5 mL via ORAL
  Filled 2014-07-17: qty 5

## 2014-07-17 MED ORDER — AZITHROMYCIN 500 MG PO TABS
500.0000 mg | ORAL_TABLET | Freq: Every day | ORAL | Status: DC
Start: 2014-07-17 — End: 2014-07-18
  Administered 2014-07-17: 500 mg via ORAL
  Filled 2014-07-17: qty 2

## 2014-07-17 MED ORDER — DEXTROSE 5 % IV SOLN
1.0000 g | Freq: Once | INTRAVENOUS | Status: AC
  Administered 2014-07-17: 1 g via INTRAVENOUS
  Filled 2014-07-17: qty 10
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
## 2014-07-18 ENCOUNTER — Observation Stay (HOSPITAL_COMMUNITY): Payer: BC Managed Care – PPO

## 2014-07-18 ENCOUNTER — Encounter (HOSPITAL_COMMUNITY): Payer: Self-pay | Admitting: *Deleted

## 2014-07-18 DIAGNOSIS — F1210 Cannabis abuse, uncomplicated: Secondary | ICD-10-CM | POA: Diagnosis present

## 2014-07-18 DIAGNOSIS — Z23 Encounter for immunization: Secondary | ICD-10-CM | POA: Diagnosis not present

## 2014-07-18 DIAGNOSIS — M25569 Pain in unspecified knee: Secondary | ICD-10-CM | POA: Diagnosis present

## 2014-07-18 DIAGNOSIS — F17200 Nicotine dependence, unspecified, uncomplicated: Secondary | ICD-10-CM | POA: Diagnosis present

## 2014-07-18 DIAGNOSIS — J9620 Acute and chronic respiratory failure, unspecified whether with hypoxia or hypercapnia: Secondary | ICD-10-CM | POA: Diagnosis present

## 2014-07-18 DIAGNOSIS — J45901 Unspecified asthma with (acute) exacerbation: Secondary | ICD-10-CM | POA: Diagnosis present

## 2014-07-18 DIAGNOSIS — M79609 Pain in unspecified limb: Secondary | ICD-10-CM | POA: Diagnosis present

## 2014-07-18 DIAGNOSIS — M25559 Pain in unspecified hip: Secondary | ICD-10-CM | POA: Diagnosis present

## 2014-07-18 DIAGNOSIS — G8929 Other chronic pain: Secondary | ICD-10-CM | POA: Diagnosis present

## 2014-07-18 DIAGNOSIS — Z9981 Dependence on supplemental oxygen: Secondary | ICD-10-CM | POA: Diagnosis not present

## 2014-07-18 DIAGNOSIS — IMO0002 Reserved for concepts with insufficient information to code with codable children: Secondary | ICD-10-CM | POA: Diagnosis not present

## 2014-07-18 DIAGNOSIS — J189 Pneumonia, unspecified organism: Principal | ICD-10-CM

## 2014-07-18 DIAGNOSIS — M79604 Pain in right leg: Secondary | ICD-10-CM | POA: Diagnosis present

## 2014-07-18 LAB — CBC WITH DIFFERENTIAL/PLATELET
BASOS PCT: 0 % (ref 0–1)
Basophils Absolute: 0 10*3/uL (ref 0.0–0.1)
EOS ABS: 0 10*3/uL (ref 0.0–0.7)
EOS PCT: 0 % (ref 0–5)
HEMATOCRIT: 35.8 % — AB (ref 39.0–52.0)
Hemoglobin: 12.1 g/dL — ABNORMAL LOW (ref 13.0–17.0)
Lymphocytes Relative: 5 % — ABNORMAL LOW (ref 12–46)
Lymphs Abs: 0.5 10*3/uL — ABNORMAL LOW (ref 0.7–4.0)
MCH: 29 pg (ref 26.0–34.0)
MCHC: 33.8 g/dL (ref 30.0–36.0)
MCV: 85.9 fL (ref 78.0–100.0)
MONO ABS: 0.1 10*3/uL (ref 0.1–1.0)
Monocytes Relative: 1 % — ABNORMAL LOW (ref 3–12)
NEUTROS ABS: 9.1 10*3/uL — AB (ref 1.7–7.7)
Neutrophils Relative %: 94 % — ABNORMAL HIGH (ref 43–77)
Platelets: 300 10*3/uL (ref 150–400)
RBC: 4.17 MIL/uL — ABNORMAL LOW (ref 4.22–5.81)
RDW: 14.7 % (ref 11.5–15.5)
WBC: 9.7 10*3/uL (ref 4.0–10.5)

## 2014-07-18 LAB — CBC
HCT: 36.9 % — ABNORMAL LOW (ref 39.0–52.0)
HEMATOCRIT: 35.9 % — AB (ref 39.0–52.0)
HEMOGLOBIN: 12.1 g/dL — AB (ref 13.0–17.0)
HEMOGLOBIN: 12.3 g/dL — AB (ref 13.0–17.0)
MCH: 29 pg (ref 26.0–34.0)
MCH: 29.2 pg (ref 26.0–34.0)
MCHC: 33.7 g/dL (ref 30.0–36.0)
MCHC: 33.3 g/dL (ref 30.0–36.0)
MCV: 86.1 fL (ref 78.0–100.0)
MCV: 87.6 fL (ref 78.0–100.0)
Platelets: 310 10*3/uL (ref 150–400)
Platelets: 313 10*3/uL (ref 150–400)
RBC: 4.17 MIL/uL — ABNORMAL LOW (ref 4.22–5.81)
RBC: 4.21 MIL/uL — AB (ref 4.22–5.81)
RDW: 14.7 % (ref 11.5–15.5)
RDW: 15 % (ref 11.5–15.5)
WBC: 9.6 10*3/uL (ref 4.0–10.5)
WBC: 15.4 10*3/uL — AB (ref 4.0–10.5)

## 2014-07-18 LAB — COMPREHENSIVE METABOLIC PANEL
ALBUMIN: 3 g/dL — AB (ref 3.5–5.2)
ALK PHOS: 61 U/L (ref 39–117)
ALT: 23 U/L (ref 0–53)
ANION GAP: 16 — AB (ref 5–15)
AST: 19 U/L (ref 0–37)
BUN: 12 mg/dL (ref 6–23)
CO2: 20 mEq/L (ref 19–32)
Calcium: 8.6 mg/dL (ref 8.4–10.5)
Chloride: 100 mEq/L (ref 96–112)
Creatinine, Ser: 0.84 mg/dL (ref 0.50–1.35)
GFR calc Af Amer: >90 mL/min (ref >90)
GFR calc non Af Amer: >90 mL/min (ref >90)
GLUCOSE: 193 mg/dL — AB (ref 70–99)
POTASSIUM: 3.6 meq/L — AB (ref 3.7–5.3)
Sodium: 136 mEq/L — ABNORMAL LOW (ref 137–147)
Total Bilirubin: 0.3 mg/dL (ref 0.3–1.2)
Total Protein: 6.8 g/dL (ref 6.0–8.3)

## 2014-07-18 LAB — LEGIONELLA ANTIGEN, URINE: LEGIONELLA ANTIGEN, URINE: NEGATIVE

## 2014-07-18 LAB — CREATININE, SERUM
Creatinine, Ser: 0.86 mg/dL (ref 0.50–1.35)
GFR calc Af Amer: >90 mL/min (ref >90)

## 2014-07-18 LAB — MAGNESIUM: MAGNESIUM: 3 mg/dL — AB (ref 1.5–2.5)

## 2014-07-18 LAB — HIV ANTIBODY (ROUTINE TESTING W REFLEX): HIV: NONREACTIVE

## 2014-07-18 LAB — STREP PNEUMONIAE URINARY ANTIGEN: Strep Pneumo Urinary Antigen: NEGATIVE

## 2014-07-18 MED ORDER — METHYLPREDNISOLONE SODIUM SUCC 125 MG IJ SOLR
60.0000 mg | Freq: Three times a day (TID) | INTRAMUSCULAR | Status: DC
  Administered 2014-07-18 – 2014-07-19 (×3): 60 mg via INTRAVENOUS
  Filled 2014-07-18 (×5): qty 0.96

## 2014-07-18 MED ORDER — METHYLPREDNISOLONE SODIUM SUCC 125 MG IJ SOLR
125.0000 mg | Freq: Three times a day (TID) | INTRAMUSCULAR | Status: DC
  Administered 2014-07-18 (×2): 125 mg via INTRAVENOUS
  Filled 2014-07-18 (×4): qty 2

## 2014-07-18 MED ORDER — IPRATROPIUM-ALBUTEROL 0.5-2.5 (3) MG/3ML IN SOLN
3.0000 mL | RESPIRATORY_TRACT | Status: DC
  Administered 2014-07-18: 3 mL via RESPIRATORY_TRACT
  Filled 2014-07-18 (×2): qty 3

## 2014-07-18 MED ORDER — DEXTROSE 5 % IV SOLN
500.0000 mg | INTRAVENOUS | Status: DC
  Administered 2014-07-18 – 2014-07-19 (×2): 500 mg via INTRAVENOUS
  Filled 2014-07-18 (×2): qty 500

## 2014-07-18 MED ORDER — MAGNESIUM SULFATE 40 MG/ML IJ SOLN
2.0000 g | Freq: Once | INTRAMUSCULAR | Status: AC
  Administered 2014-07-18: 2 g via INTRAVENOUS
  Filled 2014-07-18: qty 50

## 2014-07-18 MED ORDER — SODIUM CHLORIDE 0.9 % IV SOLN: INTRAVENOUS | Status: DC

## 2014-07-18 MED ORDER — POTASSIUM CHLORIDE IN NACL 20-0.9 MEQ/L-% IV SOLN
INTRAVENOUS | Status: DC
  Administered 2014-07-18: 21:00:00 via INTRAVENOUS
  Administered 2014-07-18: 75 mL/h via INTRAVENOUS
  Filled 2014-07-18 (×7): qty 1000

## 2014-07-18 MED ORDER — PNEUMOCOCCAL VAC POLYVALENT 25 MCG/0.5ML IJ INJ
0.5000 mL | INJECTION | INTRAMUSCULAR | Status: AC
  Administered 2014-07-19: 0.5 mL via INTRAMUSCULAR
  Filled 2014-07-18: qty 0.5

## 2014-07-18 MED ORDER — IPRATROPIUM-ALBUTEROL 0.5-2.5 (3) MG/3ML IN SOLN
3.0000 mL | Freq: Three times a day (TID) | RESPIRATORY_TRACT | Status: DC
  Administered 2014-07-18 – 2014-07-19 (×4): 3 mL via RESPIRATORY_TRACT
  Filled 2014-07-18 (×5): qty 3

## 2014-07-18 MED ORDER — DEXTROSE 5 % IV SOLN
1.0000 g | INTRAVENOUS | Status: DC
  Administered 2014-07-18 – 2014-07-19 (×2): 1 g via INTRAVENOUS
  Filled 2014-07-18 (×3): qty 10

## 2014-07-18 MED ORDER — IPRATROPIUM-ALBUTEROL 0.5-2.5 (3) MG/3ML IN SOLN: 3.0000 mL | RESPIRATORY_TRACT | Status: DC | PRN

## 2014-07-18 MED ORDER — HEPARIN SODIUM (PORCINE) 5000 UNIT/ML IJ SOLN
5000.0000 [IU] | Freq: Three times a day (TID) | INTRAMUSCULAR | Status: DC
  Administered 2014-07-18 – 2014-07-19 (×5): 5000 [IU] via SUBCUTANEOUS
  Filled 2014-07-18 (×7): qty 1

## 2014-07-18 NOTE — Progress Notes (Signed)
Called ED for report. Room ready for an  admit.

## 2014-07-18 NOTE — ED Provider Notes (Signed)
Medical screening examination/treatment/procedure(s) were conducted as a shared visit with non-physician practitioner(s) and myself.  I personally evaluated the patient during the encounter.   EKG Interpretation   Date/Time:  Thursday July 17 2014 21:31:16 EDT Ventricular Rate:  107 PR Interval:  112 QRS Duration: 84 QT Interval:  309 QTC Calculation: 412 R Axis:   78 Text Interpretation:  Sinus tachycardia Right atrial enlargement No  previous ECGs available Confirmed by Bebe Shaggy  MD, Dorinda Hill ([id number]) on  07/17/2014 9:43:09 PM       Pt with continued wheezing, mild hypoxia on RA and also noted to have pneumonia Will admit Pt agreeable with plan  Joya Gaskins, MD 07/18/14 [phone]
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
## 2014-07-18 NOTE — Progress Notes (Signed)
Utilization review completed.  
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
## 2014-07-19 ENCOUNTER — Inpatient Hospital Stay (HOSPITAL_COMMUNITY): Payer: BC Managed Care – PPO

## 2014-07-19 DIAGNOSIS — F17200 Nicotine dependence, unspecified, uncomplicated: Secondary | ICD-10-CM

## 2014-07-19 DIAGNOSIS — J9600 Acute respiratory failure, unspecified whether with hypoxia or hypercapnia: Secondary | ICD-10-CM

## 2014-07-19 DIAGNOSIS — J45901 Unspecified asthma with (acute) exacerbation: Secondary | ICD-10-CM

## 2014-07-19 LAB — CBC WITH DIFFERENTIAL/PLATELET
BASOS ABS: 0 10*3/uL (ref 0.0–0.1)
BASOS PCT: 0 % (ref 0–1)
EOS ABS: 0 10*3/uL (ref 0.0–0.7)
Eosinophils Relative: 0 % (ref 0–5)
HCT: 36.5 % — ABNORMAL LOW (ref 39.0–52.0)
Hemoglobin: 12.2 g/dL — ABNORMAL LOW (ref 13.0–17.0)
Lymphocytes Relative: 6 % — ABNORMAL LOW (ref 12–46)
Lymphs Abs: 1.1 10*3/uL (ref 0.7–4.0)
MCH: 28.6 pg (ref 26.0–34.0)
MCHC: 33.4 g/dL (ref 30.0–36.0)
MCV: 85.5 fL (ref 78.0–100.0)
Monocytes Absolute: 1.8 10*3/uL — ABNORMAL HIGH (ref 0.1–1.0)
Monocytes Relative: 9 % (ref 3–12)
NEUTROS PCT: 85 % — AB (ref 43–77)
Neutro Abs: 16 10*3/uL — ABNORMAL HIGH (ref 1.7–7.7)
Platelets: 331 10*3/uL (ref 150–400)
RBC: 4.27 MIL/uL (ref 4.22–5.81)
RDW: 15 % (ref 11.5–15.5)
WBC: 18.9 10*3/uL — ABNORMAL HIGH (ref 4.0–10.5)

## 2014-07-19 LAB — COMPREHENSIVE METABOLIC PANEL
ALK PHOS: 58 U/L (ref 39–117)
ALT: 25 U/L (ref 0–53)
ANION GAP: 11 (ref 5–15)
AST: 22 U/L (ref 0–37)
Albumin: 2.7 g/dL — ABNORMAL LOW (ref 3.5–5.2)
BUN: 11 mg/dL (ref 6–23)
CHLORIDE: 104 meq/L (ref 96–112)
CO2: 23 mEq/L (ref 19–32)
Calcium: 8.7 mg/dL (ref 8.4–10.5)
Creatinine, Ser: 0.75 mg/dL (ref 0.50–1.35)
GFR calc Af Amer: >90 mL/min (ref >90)
GFR calc non Af Amer: >90 mL/min (ref >90)
GLUCOSE: 129 mg/dL — AB (ref 70–99)
POTASSIUM: 4.4 meq/L (ref 3.7–5.3)
SODIUM: 138 meq/L (ref 137–147)
Total Bilirubin: 0.2 mg/dL — ABNORMAL LOW (ref 0.3–1.2)
Total Protein: 6.5 g/dL (ref 6.0–8.3)

## 2014-07-19 MED ORDER — MOMETASONE FURO-FORMOTEROL FUM 100-5 MCG/ACT IN AERO: 2.0000 | INHALATION_SPRAY | Freq: Two times a day (BID) | RESPIRATORY_TRACT | Status: DC

## 2014-07-19 MED ORDER — ALBUTEROL SULFATE 0.63 MG/3ML IN NEBU: 1.0000 | INHALATION_SOLUTION | Freq: Four times a day (QID) | RESPIRATORY_TRACT | Status: DC | PRN

## 2014-07-19 MED ORDER — PREDNISONE (PAK) 10 MG PO TABS: ORAL_TABLET | ORAL | Status: DC

## 2014-07-19 MED ORDER — LEVOFLOXACIN 750 MG PO TABS: 750.0000 mg | ORAL_TABLET | Freq: Every day | ORAL | Status: DC

## 2014-07-19 MED ORDER — ALBUTEROL SULFATE HFA 108 (90 BASE) MCG/ACT IN AERS: 2.0000 | INHALATION_SPRAY | RESPIRATORY_TRACT | Status: DC | PRN

## 2014-07-19 NOTE — Progress Notes (Signed)
Pt escorted out via wheelchair and RN at 1515. Discharge instructions reviewed with pt. Pt verbalized understanding of where to pick up [facility] prescription, and how to take. Pt to f/u with primary care appt on Monday. NSL d/c'd with cath intact. Hortencia Conradi, RN
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
## 2014-07-24 LAB — CULTURE, BLOOD (ROUTINE X 2)
CULTURE: NO GROWTH
Culture: NO GROWTH

## 2014-08-13 ENCOUNTER — Ambulatory Visit (HOSPITAL_BASED_OUTPATIENT_CLINIC_OR_DEPARTMENT_OTHER): Payer: BC Managed Care – PPO | Attending: Internal Medicine | Admitting: Radiology

## 2014-08-13 VITALS — Ht 70.0 in | Wt 206.0 lb

## 2014-08-13 DIAGNOSIS — G4730 Sleep apnea, unspecified: Secondary | ICD-10-CM | POA: Diagnosis present

## 2014-08-13 DIAGNOSIS — G4733 Obstructive sleep apnea (adult) (pediatric): Secondary | ICD-10-CM | POA: Diagnosis not present

## 2014-08-13 DIAGNOSIS — G4763 Sleep related bruxism: Secondary | ICD-10-CM | POA: Insufficient documentation

## 2014-08-13 DIAGNOSIS — R5383 Other fatigue: Secondary | ICD-10-CM

## 2014-08-13 DIAGNOSIS — R0683 Snoring: Secondary | ICD-10-CM

## 2014-08-13 DIAGNOSIS — G4710 Hypersomnia, unspecified: Secondary | ICD-10-CM

## 2014-08-20 DIAGNOSIS — G4733 Obstructive sleep apnea (adult) (pediatric): Secondary | ICD-10-CM

## 2014-08-20 NOTE — Sleep Study (Signed)
   NAME: Jacob Vaughan DATE OF BIRTH:  02/27/1981 MEDICAL RECORD NUMBER [id number]  LOCATION: [facility]  PHYSICIAN: YOUNG,CLINTON D  DATE OF STUDY: 08/13/2014  SLEEP STUDY TYPE: Nocturnal Polysomnogram               REFERRING PHYSICIAN: Jessica PriestKozlow, Eric J, MD  INDICATION FOR STUDY: Hypersomnia with sleep apnea  EPWORTH SLEEPINESS SCORE:   13/24 HEIGHT: 5\' 10"  (177.8 cm)  WEIGHT: 206 lb (93.441 kg)    Body mass index is 29.56 kg/(m^2).  NECK SIZE: 14.5 in.  MEDICATIONS: Charted for review  SLEEP ARCHITECTURE: Split study protocol. During the diagnostic phase, total sleep time was 122.5 minutes with sleep efficiency 74.2%. Stage I was 9.8%, stage II 55.1%, stage III 10.6%, REM 24.5% of total sleep time. Sleep latency 33 minutes, REM latency 64 minutes, awake after sleep onset 8 minutes, arousal index 26.9, bedtime medication: Albuterol inhaler  RESPIRATORY DATA: Apnea hypopneas index (AHI) 17.6 per hour. 36 total events scored including 5 obstructive apneas, 4 central apneas, 27 hypopneas. Events were more common while supine but seen in all positions. REM AHI 60 per hour. CPAP was titrated to 13 CWP, AHI 2.9 per hour. He wore a medium fullface mask.  OXYGEN DATA: Moderate snoring before CPAP with oxygen desaturation to a nadir of 85%. With CPAP control, snoring was prevented and mean oxygen saturation was 94% on room air.  CARDIAC DATA: Sinus rhythm with rare PAC  MOVEMENT/PARASOMNIA: Bruxism was noted. No significant movement disturbance. Bathroom x1.  IMPRESSION/ RECOMMENDATION:   1) Moderate obstructive sleep apnea/hypopneas syndrome, AHI 17.6 per hour with events in all positions, especially while supine. REM AHI 60 per hour. Moderate snoring with oxygen desaturation to a nadir of 85% on room air 2) Successful CPAP titration to 13 CWP, AHI 2.9 per hour. He wore a medium [employer] Simplus fullface mask with heated humidifier. Snoring was prevented and mean  oxygen saturation was 94% on room air.  3) bruxism  Waymon BudgeYOUNG,CLINTON D Diplomate, American Board of Sleep Medicine  ELECTRONICALLY SIGNED ON:  08/20/2014, 2:43 PM [facility] PH: [phone]   FX: [phone] ACCREDITED BY THE AMERICAN ACADEMY OF SLEEP MEDICINE

## 2014-10-01 ENCOUNTER — Institutional Professional Consult (permissible substitution): Payer: BC Managed Care – PPO | Admitting: Pulmonary Disease

## 2014-10-10 ENCOUNTER — Ambulatory Visit (INDEPENDENT_AMBULATORY_CARE_PROVIDER_SITE_OTHER): Payer: BC Managed Care – PPO | Admitting: Pulmonary Disease

## 2014-10-10 ENCOUNTER — Encounter: Payer: Self-pay | Admitting: Pulmonary Disease

## 2014-10-10 VITALS — BP 118/74 | HR 64 | Temp 97.0°F | Ht 71.0 in | Wt 214.2 lb

## 2014-10-10 DIAGNOSIS — G4733 Obstructive sleep apnea (adult) (pediatric): Secondary | ICD-10-CM

## 2014-10-10 NOTE — Assessment & Plan Note (Signed)
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 I have had a long discussion with him about the pathophysiology of sleep apnea, including its impact to his quality of life and cardiovascular health. This degree of sleep apnea is not an overly significant health risk, but can certainly impact quality of life. I have outlined different approaches for treatment, including a trial of weight loss alone, as well as more aggressive treatment with a dental appliance or CPAP. The patient is concerned about the cost of C Pap and a dental appliance, and for now would like to work on weight loss alone. He understands that he can call at any time if he changes his mind and would like to try more aggressive treatment.

## 2014-10-10 NOTE — Progress Notes (Signed)
Subjective:    Patient ID: Jacob Vaughan, male    DOB: 05/08/1981, 33 y.o.   MRN: [id number]  HPI The patient is a 33 year old male who I've been asked to see for management of obstructive sleep apnea. He recently underwent a split night study where he was found to have an AHI of 18 events per hour, with oxygen desaturation as low as 85%. He was started on C Pap, and titrated as high as 13 cm of water pressure. The patient has been noted to have loud snoring, as well as witnessed apneas by his bed partner. He denies frequent awakenings at night, but is not rested in the mornings upon arising. He stays very active at his job, but does note some sleep pressure with periods of inactivity during the day. He will fall asleep easily in the evenings with television or movies, but starts his day very early at 4:20 AM. He denies any sleepiness with driving. The patient states that his weight is up 65 pounds over the last 2 years, and his Epworth score today is 5.   Sleep Questionnaire What time do you typically go to bed?( Between what hours) 9-11PM 9-11PM at 1608 on 10/10/14 by Tommie SamsMindy S Silva, CMA How long does it take you to fall asleep? 10-15 min 10-15 min at 1608 on 10/10/14 by Tommie SamsMindy S Silva, CMA How many times during the night do you wake up? 2 2 at 1608 on 10/10/14 by Tommie SamsMindy S Silva, CMA What time do you get out of bed to start your day? [id number] at 1608 on 10/10/14 by Tommie SamsMindy S Silva, CMA Do you drive or operate heavy machinery in your occupation? No No at 1608 on 10/10/14 by Tommie SamsMindy S Silva, CMA How much has your weight changed (up or down) over the past two years? (In pounds) 65 lb (29.484 kg) 65 lb (29.484 kg) at 1608 on 10/10/14 by Tommie SamsMindy S Silva, CMA Have you ever had a sleep study before? Yes Yes at 1608 on 10/10/14 by Tommie SamsMindy S Silva, CMA If yes, [facility] at 1608 on 10/10/14 by Tommie SamsMindy S Silva, CMA If yes, date of study? 08/13/14 08/13/14 at 1608 on 10/10/14 by Tommie SamsMindy S  Silva, CMA Do you currently use CPAP? No No at 1608 on 10/10/14 by Tommie SamsMindy S Silva, CMA Do you wear oxygen at any time? No No at 1608 on 10/10/14 by Tommie SamsMindy S Silva, CMA   Review of Systems  Constitutional: Negative for fever and unexpected weight change.  HENT: Positive for congestion and postnasal drip. Negative for dental problem, ear pain, nosebleeds, rhinorrhea, sinus pressure, sneezing, sore throat and trouble swallowing.   Eyes: Negative for redness and itching.  Respiratory: Positive for shortness of breath. Negative for cough, chest tightness and wheezing.   Cardiovascular: Negative for palpitations and leg swelling.  Gastrointestinal: Negative for nausea and vomiting.  Genitourinary: Negative for dysuria.  Musculoskeletal: Negative for joint swelling.  Skin: Negative for rash.  Neurological: Negative for headaches.  Hematological: Does not bruise/bleed easily.  Psychiatric/Behavioral: Negative for dysphoric mood. The patient is not nervous/anxious.        Objective:   Physical Exam Constitutional:  Overweight male, no acute distress  HENT:  Nares patent without discharge, deviated septum to left with narrowing.  Oropharynx without exudate, palate and uvula are mildly elongated.  Lower jaw smallish with overbite.  Eyes:  Perrla, eomi, no scleral icterus  Neck:  No JVD, no TMG  Cardiovascular:  Normal rate,  regular rhythm, no rubs or gallops.  No murmurs        Intact distal pulses  Pulmonary :  Normal breath sounds, no stridor or respiratory distress   No rales, rhonchi, or wheezing  Abdominal:  Soft, nondistended, bowel sounds present.  No tenderness noted.   Musculoskeletal:  No lower extremity edema noted.  Lymph Nodes:  No cervical lymphadenopathy noted  Skin:  No cyanosis noted  Neurologic:  Alert, appropriate, moves all 4 extremities without obvious deficit.         Assessment & Plan:

## 2014-10-10 NOTE — Patient Instructions (Signed)
Think about the different options to treat your mild to moderate sleep apnea.  Can take some time to work on weight reduction, can consider a dental appliance, and can consider cpap.  He is concerned about the cost of the last 2, and for now will work on weight loss.  He is to call if he changes his mind.

## 2015-06-12 ENCOUNTER — Encounter (HOSPITAL_COMMUNITY): Payer: Self-pay | Admitting: Emergency Medicine

## 2015-06-12 ENCOUNTER — Emergency Department (HOSPITAL_COMMUNITY)
Admission: EM | Admit: 2015-06-12 | Discharge: 2015-06-12 | Disposition: A | Discharge status: Home / Self Care | Payer: Self-pay | Attending: Physician Assistant | Admitting: Physician Assistant

## 2015-06-12 ENCOUNTER — Emergency Department (HOSPITAL_COMMUNITY): Payer: Self-pay

## 2015-06-12 DIAGNOSIS — Z720 Tobacco use: Secondary | ICD-10-CM | POA: Insufficient documentation

## 2015-06-12 DIAGNOSIS — R51 Headache: Secondary | ICD-10-CM | POA: Insufficient documentation

## 2015-06-12 DIAGNOSIS — Z79899 Other long term (current) drug therapy: Secondary | ICD-10-CM | POA: Insufficient documentation

## 2015-06-12 DIAGNOSIS — J45909 Unspecified asthma, uncomplicated: Secondary | ICD-10-CM | POA: Insufficient documentation

## 2015-06-12 DIAGNOSIS — R42 Dizziness and giddiness: Secondary | ICD-10-CM | POA: Insufficient documentation

## 2015-06-12 DIAGNOSIS — Z9981 Dependence on supplemental oxygen: Secondary | ICD-10-CM | POA: Insufficient documentation

## 2015-06-12 DIAGNOSIS — R1013 Epigastric pain: Secondary | ICD-10-CM | POA: Insufficient documentation

## 2015-06-12 DIAGNOSIS — R110 Nausea: Secondary | ICD-10-CM | POA: Insufficient documentation

## 2015-06-12 DIAGNOSIS — R1011 Right upper quadrant pain: Secondary | ICD-10-CM | POA: Insufficient documentation

## 2015-06-12 DIAGNOSIS — R109 Unspecified abdominal pain: Secondary | ICD-10-CM

## 2015-06-12 DIAGNOSIS — Z8701 Personal history of pneumonia (recurrent): Secondary | ICD-10-CM | POA: Insufficient documentation

## 2015-06-12 LAB — CBC WITH DIFFERENTIAL/PLATELET
Basophils Absolute: 0 10*3/uL (ref 0.0–0.1)
Basophils Relative: 1 % (ref 0–1)
EOS ABS: 0.3 10*3/uL (ref 0.0–0.7)
Eosinophils Relative: 4 % (ref 0–5)
HEMATOCRIT: 41.5 % (ref 39.0–52.0)
Hemoglobin: 14.2 g/dL (ref 13.0–17.0)
Lymphocytes Relative: 21 % (ref 12–46)
Lymphs Abs: 1.5 10*3/uL (ref 0.7–4.0)
MCH: 29.8 pg (ref 26.0–34.0)
MCHC: 34.2 g/dL (ref 30.0–36.0)
MCV: 87.2 fL (ref 78.0–100.0)
MONO ABS: 1.1 10*3/uL — AB (ref 0.1–1.0)
MONOS PCT: 15 % — AB (ref 3–12)
NEUTROS ABS: 4.4 10*3/uL (ref 1.7–7.7)
NEUTROS PCT: 59 % (ref 43–77)
Platelets: 282 10*3/uL (ref 150–400)
RBC: 4.76 MIL/uL (ref 4.22–5.81)
RDW: 14.8 % (ref 11.5–15.5)
WBC: 7.3 10*3/uL (ref 4.0–10.5)

## 2015-06-12 LAB — LIPASE, BLOOD: Lipase: 24 U/L (ref 22–51)

## 2015-06-12 LAB — COMPREHENSIVE METABOLIC PANEL
ALT: 31 U/L (ref 17–63)
AST: 24 U/L (ref 15–41)
Albumin: 3.4 g/dL — ABNORMAL LOW (ref 3.5–5.0)
Alkaline Phosphatase: 51 U/L (ref 38–126)
Anion gap: 8 (ref 5–15)
BILIRUBIN TOTAL: 0.7 mg/dL (ref 0.3–1.2)
BUN: 12 mg/dL (ref 6–20)
CO2: 25 mmol/L (ref 22–32)
Calcium: 8.6 mg/dL — ABNORMAL LOW (ref 8.9–10.3)
Chloride: 104 mmol/L (ref 101–111)
Creatinine, Ser: 1 mg/dL (ref 0.61–1.24)
GFR calc Af Amer: >60 mL/min (ref >60)
Glucose, Bld: 96 mg/dL (ref 65–99)
Potassium: 3.4 mmol/L — ABNORMAL LOW (ref 3.5–5.1)
SODIUM: 137 mmol/L (ref 135–145)
TOTAL PROTEIN: 6.1 g/dL — AB (ref 6.5–8.1)

## 2015-06-12 MED ORDER — HYDROMORPHONE HCL 1 MG/ML IJ SOLN
1.0000 mg | Freq: Once | INTRAMUSCULAR | Status: AC
  Administered 2015-06-12: 1 mg via INTRAVENOUS
  Filled 2015-06-12: qty 1

## 2015-06-12 MED ORDER — SUCRALFATE 1 G PO TABS
1.0000 g | ORAL_TABLET | Freq: Three times a day (TID) | ORAL | Status: DC
Start: 2015-06-12 — End: 2015-07-01

## 2015-06-12 MED ORDER — OXYCODONE-ACETAMINOPHEN 5-325 MG PO TABS
1.0000 | ORAL_TABLET | Freq: Once | ORAL | Status: AC
  Administered 2015-06-12: 1 via ORAL
  Filled 2015-06-12: qty 1

## 2015-06-12 MED ORDER — ONDANSETRON HCL 4 MG/2ML IJ SOLN
4.0000 mg | Freq: Once | INTRAMUSCULAR | Status: AC
  Administered 2015-06-12: 4 mg via INTRAVENOUS
  Filled 2015-06-12: qty 2

## 2015-06-12 MED ORDER — GI COCKTAIL ~~LOC~~
30.0000 mL | Freq: Once | ORAL | Status: AC
  Administered 2015-06-12: 30 mL via ORAL
  Filled 2015-06-12: qty 30

## 2015-06-12 MED ORDER — OXYCODONE-ACETAMINOPHEN 5-325 MG PO TABS: 1.0000 | ORAL_TABLET | Freq: Four times a day (QID) | ORAL | Status: DC | PRN

## 2015-06-12 NOTE — ED Notes (Signed)
Pt sts upper abd pain with radiation to middle area x 2 days worse today with nausea and some HA

## 2015-06-12 NOTE — ED Provider Notes (Signed)
CSN: [id number]     Arrival date & time 06/12/15  1139 History  This chart was scribed for non-physician provider Renne Crigler, PA-C, working with Abelino Derrick, MD by Phillis Haggis, ED Scribe. This patient was seen in room C29C/C29C and patient care was started at 1:25 PM.   Chief Complaint  Patient presents with  . Abdominal Pain  . Nausea   The history is provided by the patient. No language interpreter was used.   HPI Comments: Jacob Vaughan is a 34 y.o. male who presents to the Emergency Department complaining of deep, sharp upper abdominal pain onset one day ago. Pt states that he thought it was associated with going to the bathroom or being hungry, but states that the pain worsened after eating barbeque, rice and corn; states that it has been constant. He ate a sausage, egg, and cheese biscuit this morning which made symptoms worse again. Reports worsening pain to the right abdomen with movement and deep breathing; reports associated nausea, dizziness, and intermittent headaches. Reports last eating at 7:30 AM this morning. Denies chest pain, vomiting, diarrhea, dysuria, difficulty urinating, and SOB. Denies taking ibuprofen or Goody Powder. Denies hx of ulcers, gall stones, abdominal surgery or heart problems. No HTN, high cholesterol, diabetes, or strong family history of CAD. Patient does smoke. Denies having a PCP. Wife reports that pt takes Prilosec, Zantac and Tylenol when needed. Reports family hx of heart problems and stroke. No alcohol or heavy NSAID use.   Past Medical History  Diagnosis Date  . Asthma   . Exertional shortness of breath     "recently" (11/19/2013)  . On home oxygen therapy     "2L when I sleep at night" (11/19/2013)  . Pneumonia 07/2013   Past Surgical History  Procedure Laterality Date  . No past surgeries     Family History  Problem Relation Age of Onset  . Asthma Brother   . Diabetes Mother   . Sleep apnea Mother   . Asthma Mother    History   Substance Use Topics  . Smoking status: Current Every Day Smoker -- 0.20 packs/day for 18 years    Types: Cigarettes  . Smokeless tobacco: Never Used     Comment: 4 cigs daily 10/10/14  . Alcohol Use: 0.0 oz/week    0 Standard drinks or equivalent per week     Comment: occas    Review of Systems  Constitutional: Negative for fever.  HENT: Negative for rhinorrhea and sore throat.   Eyes: Negative for redness.  Respiratory: Negative for cough and shortness of breath.   Cardiovascular: Negative for chest pain.  Gastrointestinal: Positive for nausea and abdominal pain. Negative for vomiting and diarrhea.  Genitourinary: Negative for dysuria and difficulty urinating.  Musculoskeletal: Negative for myalgias.  Skin: Negative for rash.  Neurological: Positive for dizziness and headaches.   Allergies  Review of patient's allergies indicates no known allergies.  Home Medications   Prior to Admission medications   Medication Sig Start Date End Date Taking? Authorizing Provider  albuterol (ACCUNEB) 0.63 MG/3ML nebulizer solution Take 3 mLs (0.63 mg total) by nebulization every 6 (six) hours as needed for wheezing. 07/19/14   Jeralyn Bennett, MD  albuterol (PROVENTIL HFA;VENTOLIN HFA) 108 (90 BASE) MCG/ACT inhaler Inhale 2 puffs into the lungs every 4 (four) hours as needed for wheezing or shortness of breath. 07/19/14   Jeralyn Bennett, MD   BP 107/68 mmHg  Pulse 78  Temp(Src) 97.7 F (36.5 C) (Oral)  Resp 18  Ht  (1.803 m)  Wt 211 lb (95.709 kg)  BMI 29.44 kg/m2  SpO2 97%  Physical Exam  Constitutional: He is oriented to person, place, and time. He appears well-developed and well-nourished.  HENT:  Head: Normocephalic and atraumatic.  Eyes: Conjunctivae and EOM are normal. Right eye exhibits no discharge. Left eye exhibits no discharge.  Neck: Normal range of motion. Neck supple.  Cardiovascular: Normal rate, regular rhythm and normal heart sounds.   No murmur  heard. Pulmonary/Chest: Effort normal and breath sounds normal. No respiratory distress. He has no wheezes. He has no rales.  Abdominal: Soft. Bowel sounds are normal. There is no hepatosplenomegaly. There is tenderness in the right upper quadrant and epigastric area. There is no rigidity, no rebound, no guarding, no CVA tenderness, no tenderness at McBurney's point and negative Murphy's sign.  Musculoskeletal: Normal range of motion.  Neurological: He is alert and oriented to person, place, and time.  Skin: Skin is warm and dry.  Psychiatric: He has a normal mood and affect. His behavior is normal.  Nursing note and vitals reviewed.   ED Course  Procedures (including critical care time) DIAGNOSTIC STUDIES: Oxygen Saturation is 97% on RA, normal by my interpretation.    COORDINATION OF CARE: 1:31 PM-Discussed treatment plan which includes discussing lab work results, [place] of abdomen, and referral to PCP with pt at bedside and pt agreed to plan.    Labs Review Labs Reviewed  COMPREHENSIVE METABOLIC PANEL - Abnormal; Notable for the following:    Potassium 3.4 (*)    Calcium 8.6 (*)    Total Protein 6.1 (*)    Albumin 3.4 (*)    All other components within normal limits  CBC WITH DIFFERENTIAL/PLATELET - Abnormal; Notable for the following:    Monocytes Relative 15 (*)    Monocytes Absolute 1.1 (*)    All other components within normal limits  LIPASE, BLOOD    Imaging Review US Abdomen Complete  06/12/2015   CLINICAL DATA:  Abdominal pain  EXAM: ULTRASOUND ABDOMEN COMPLETE  COMPARISON:  None.  FINDINGS: Gallbladder: No gallstones or wall thickening visualized. There is no pericholecystic fluid. No sonographic Murphy sign noted.  Common bile duct: Diameter: 1 mm. There is no intrahepatic, common hepatic, or common bile duct dilatation.  Liver: No focal lesion identified. Within normal limits in parenchymal echogenicity.  IVC: No abnormality visualized.  Pancreas: No mass or inflammatory  focus.  Spleen: Size and appearance within normal limits.  Right Kidney: Length: 9.3 cm. Echogenicity within normal limits. No mass or hydronephrosis visualized.  Left Kidney: Length: 9.6 cm. Echogenicity within normal limits. No mass or hydronephrosis visualized.  Abdominal aorta: No aneurysm visualized.  Other findings: No demonstrable ascites.  IMPRESSION: Study within normal limits.   Electronically Signed   By: Bretta Bang III M.D.   On: 06/12/2015 16:05     EKG Interpretation None       Vital signs reviewed and are as follows: Filed Vitals:   06/12/15 1630  BP: 119/65  Pulse: 71  Temp:   Resp: 16   Patient did not get much relief with GI cocktail, somewhat better after IV pain medication. Patient and family updated on ultrasound results. We reviewed blood test results.  Encouraged patient to continue Prilosec and Zantac at home. Added Carafate and Percocet as needed for severe pain.  Patient counseled on use of narcotic pain medications. Counseled not to combine these medications with others containing tylenol. Urged not  to drink alcohol, drive, or perform any other activities that requires focus while taking these medications. The patient verbalizes understanding and agrees with the plan.  The patient was urged to return to the Emergency Department immediately with worsening of current symptoms, worsening abdominal pain, persistent vomiting, blood noted in stools, fever, or any other concerns. The patient verbalized understanding.    MDM   Final diagnoses:  Abdominal pain  Epigastric pain   Patient with epigastric and right upper quadrant pain that is worse with eating. No chest pain or shortness of breath. Do strong risk factors for CAD other than smoking. Workup today shows normal gallbladder, unremarkable labs. Symptoms controlled in ED. Symptoms most likely due to gastritis or peptic ulcer disease. Will focus treatment on this and have patient follow-up with PCP.  Referral given.  Vitals are stable, no fever.  No signs of dehydration, tolerating PO's. Lungs are clear. No focal abdominal pain, no concern for appendicitis, cholecystitis, pancreatitis, ruptured viscus, UTI, kidney stone, or any other abdominal etiology. Supportive therapy indicated with return if symptoms worsen.    BP 119/65 mmHg  Pulse 71  Temp(Src) 97.7 F (36.5 C) (Oral)  Resp 16  Ht 5\' 11"  (1.803 m)  Wt 211 lb (95.709 kg)  BMI 29.44 kg/m2  SpO2 96%   I personally performed the services described in this documentation, which was scribed in my presence. The recorded information has been reviewed and is accurate.      Renne Crigler, PA-C 06/12/15 1654  Courteney Randall An, MD 06/14/15 2035

## 2015-06-12 NOTE — Discharge Instructions (Signed)
Please read and follow all provided instructions.  Your diagnoses today include:  1. Epigastric pain   2. Abdominal pain     Tests performed today include:  Blood counts and electrolytes  Blood tests to check liver and kidney function  Blood tests to check pancreas function  Urine test to look for infection  Ultrasound - shows normal gallbladder and no other severe problems  Vital signs. See below for your results today.   Medications prescribed:   Carafate - for stomach upset and to protect your stomach   Percocet (oxycodone/acetaminophen) - narcotic pain medication  DO NOT drive or perform any activities that require you to be awake and alert because this medicine can make you drowsy. BE VERY CAREFUL not to take multiple medicines containing Tylenol (also called acetaminophen). Doing so can lead to an overdose which can damage your liver and cause liver failure and possibly death.  Take any prescribed medications only as directed.  Home care instructions:   Follow any educational materials contained in this packet.  Continue your Prilosec and Zantac  Eat a bland diet for the next 48 hours. This includes bananas, applesauce, rice, toast, yogurt  Follow-up instructions: Please follow-up with your primary care provider in the next 7 days for further evaluation of your symptoms.    Return instructions:  SEEK IMMEDIATE MEDICAL ATTENTION IF:  The pain does not go away or becomes severe   A temperature above 101F develops   Repeated vomiting occurs (multiple episodes)   The pain becomes localized to portions of the abdomen. The right side could possibly be appendicitis. In an adult, the left lower portion of the abdomen could be colitis or diverticulitis.   Blood is being passed in stools or vomit (bright red or black tarry stools)   You develop chest pain, difficulty breathing, dizziness or fainting, or become confused, poorly responsive, or inconsolable (young  children)  If you have any other emergent concerns regarding your health  Additional Information: Abdominal (belly) pain can be caused by many things. Your caregiver performed an examination and possibly ordered blood/urine tests and imaging (CT scan, x-rays, ultrasound). Many cases can be observed and treated at home after initial evaluation in the emergency department. Even though you are being discharged home, abdominal pain can be unpredictable. Therefore, you need a repeated exam if your pain does not resolve, returns, or worsens. Most patients with abdominal pain don't have to be admitted to the hospital or have surgery, but serious problems like appendicitis and gallbladder attacks can start out as nonspecific pain. Many abdominal conditions cannot be diagnosed in one visit, so follow-up evaluations are very important.  Your vital signs today were: BP 126/70 mmHg   Pulse 68   Temp(Src) 97.7 F (36.5 C) (Oral)   Resp 16   Ht  (1.803 m)   Wt 211 lb (95.709 kg)   BMI 29.44 kg/m2   SpO2 97% If your blood pressure (bp) was elevated above 135/85 this visit, please have this repeated by your doctor within one month. --------------

## 2015-06-16 ENCOUNTER — Emergency Department (HOSPITAL_COMMUNITY)
Admission: EM | Admit: 2015-06-16 | Discharge: 2015-06-16 | Disposition: A | Discharge status: Home / Self Care | Payer: BLUE CROSS/BLUE SHIELD | Source: Home / Self Care | Attending: Emergency Medicine | Admitting: Emergency Medicine

## 2015-06-16 ENCOUNTER — Encounter (HOSPITAL_COMMUNITY): Payer: Self-pay | Admitting: Emergency Medicine

## 2015-06-16 ENCOUNTER — Emergency Department (INDEPENDENT_AMBULATORY_CARE_PROVIDER_SITE_OTHER): Payer: BLUE CROSS/BLUE SHIELD

## 2015-06-16 DIAGNOSIS — J4531 Mild persistent asthma with (acute) exacerbation: Secondary | ICD-10-CM | POA: Diagnosis not present

## 2015-06-16 DIAGNOSIS — R1011 Right upper quadrant pain: Secondary | ICD-10-CM

## 2015-06-16 DIAGNOSIS — R1013 Epigastric pain: Secondary | ICD-10-CM

## 2015-06-16 MED ORDER — POLYETHYLENE GLYCOL 3350 17 GM/SCOOP PO POWD: 17.0000 g | Freq: Every day | ORAL | Status: DC

## 2015-06-16 MED ORDER — ALBUTEROL SULFATE HFA 108 (90 BASE) MCG/ACT IN AERS: 2.0000 | INHALATION_SPRAY | RESPIRATORY_TRACT | Status: DC | PRN

## 2015-06-16 NOTE — Discharge Instructions (Signed)
Abdominal Pain Many things can cause abdominal pain. Usually, abdominal pain is not caused by a disease and will improve without treatment. It can often be observed and treated at home. Your health care provider will do a physical exam and possibly order blood tests and X-rays to help determine the seriousness of your pain. However, in many cases, more time must pass before a clear cause of the pain can be found. Before that point, your health care provider may not know if you need more testing or further treatment. HOME CARE INSTRUCTIONS  Monitor your abdominal pain for any changes. The following actions may help to alleviate any discomfort you are experiencing:  Only take over-the-counter or prescription medicines as directed by your health care provider.  Do not take laxatives unless directed to do so by your health care provider.  Try a clear liquid diet (broth, tea, or water) as directed by your health care provider. Slowly move to a bland diet as tolerated. SEEK MEDICAL CARE IF:  You have unexplained abdominal pain.  You have abdominal pain associated with nausea or diarrhea.  You have pain when you urinate or have a bowel movement.  You experience abdominal pain that wakes you in the night.  You have abdominal pain that is worsened or improved by eating food.  You have abdominal pain that is worsened with eating fatty foods.  You have a fever. SEEK IMMEDIATE MEDICAL CARE IF:   Your pain does not go away within 2 hours.  You keep throwing up (vomiting).  Your pain is felt only in portions of the abdomen, such as the right side or the left lower portion of the abdomen.  You pass bloody or black tarry stools. MAKE SURE YOU:  Understand these instructions.   Will watch your condition.   Will get help right away if you are not doing well or get worse.  Document Released: 08/03/2005 Document Revised: 10/29/2013 Document Reviewed: 07/03/2013 [facility] Patient Information  2015 [place], [place]. This information is not intended to replace advice given to you by your health care provider. Make sure you discuss any questions you have with your health care provider.  Biliary Colic  Biliary colic is a steady or irregular pain in the upper abdomen. It is usually under the right side of the rib cage. It happens when gallstones interfere with the normal flow of bile from the gallbladder. Bile is a liquid that helps to digest fats. Bile is made in the liver and stored in the gallbladder. When you eat a meal, bile passes from the gallbladder through the cystic duct and the common bile duct into the small intestine. There, it mixes with partially digested food. If a gallstone blocks either of these ducts, the normal flow of bile is blocked. The muscle cells in the bile duct contract forcefully to try to move the stone. This causes the pain of biliary colic.  SYMPTOMS   A person with biliary colic usually complains of pain in the upper abdomen. This pain can be:  In the center of the upper abdomen just below the breastbone.  In the upper-right part of the abdomen, near the gallbladder and liver.  Spread back toward the right shoulder blade.  Nausea and vomiting.  The pain usually occurs after eating.  Biliary colic is usually triggered by the digestive system's demand for bile. The demand for bile is high after fatty meals. Symptoms can also occur when a person who has been fasting suddenly eats a very  large meal. Most episodes of biliary colic pass after 1 to 5 hours. After the most intense pain passes, your abdomen may continue to ache mildly for about 24 hours. DIAGNOSIS  After you describe your symptoms, your caregiver will perform a physical exam. He or she will pay attention to the upper right portion of your belly (abdomen). This is the area of your liver and gallbladder. An ultrasound will help your caregiver look for gallstones. Specialized scans of the gallbladder may  also be done. Blood tests may be done, especially if you have fever or if your pain persists. PREVENTION  Biliary colic can be prevented by controlling the risk factors for gallstones. Some of these risk factors, such as heredity, increasing age, and pregnancy are a normal part of life. Obesity and a high-fat diet are risk factors you can change through a healthy lifestyle. Women going through menopause who take hormone replacement therapy (estrogen) are also more likely to develop biliary colic. TREATMENT   Pain medication may be prescribed.  You may be encouraged to eat a fat-free diet.  If the first episode of biliary colic is severe, or episodes of colic keep retuning, surgery to remove the gallbladder (cholecystectomy) is usually recommended. This procedure can be done through small incisions using an instrument called a laparoscope. The procedure often requires a brief stay in the hospital. Some people can leave the hospital the same day. It is the most widely used treatment in people troubled by painful gallstones. It is effective and safe, with no complications in more than 90% of cases.  If surgery cannot be done, medication that dissolves gallstones may be used. This medication is expensive and can take months or years to work. Only small stones will dissolve.  Rarely, medication to dissolve gallstones is combined with a procedure called shock-wave lithotripsy. This procedure uses carefully aimed shock waves to break up gallstones. In many people treated with this procedure, gallstones form again within a few years. PROGNOSIS  If gallstones block your cystic duct or common bile duct, you are at risk for repeated episodes of biliary colic. There is also a 25% chance that you will develop a gallbladder infection(acute cholecystitis), or some other complication of gallstones within 10 to 20 years. If you have surgery, schedule it at a time that is convenient for you and at a time when you are  not sick. HOME CARE INSTRUCTIONS   Drink plenty of clear fluids.  Avoid fatty, greasy or fried foods, or any foods that make your pain worse.  Take medications as directed. SEEK MEDICAL CARE IF:   You develop a fever over 100.5 F (38.1 C).  Your pain gets worse over time.  You develop nausea that prevents you from eating and drinking.  You develop vomiting. SEEK IMMEDIATE MEDICAL CARE IF:   You have continuous or severe belly (abdominal) pain which is not relieved with medications.  You develop nausea and vomiting which is not relieved with medications.  You have symptoms of biliary colic and you suddenly develop a fever and shaking chills. This may signal cholecystitis. Call your caregiver immediately.  You develop a yellow color to your skin or the white part of your eyes (jaundice). Document Released: 03/27/2006 Document Revised: 01/16/2012 Document Reviewed: 06/05/2008 [facility] Patient Information 2015 [place], [place]. This information is not intended to replace advice given to you by your health care provider. Make sure you discuss any questions you have with your health care provider.  HIDA (Hepatobiliary) Scan Your  caregiver has suggested that you have a HIDA Scan. This is also known as a hepatobiliary scan. The HIDA Scan helps evaluate the hepatobiliary system (liver and gallbladder and their ducts). Your liver is the organ in your body that produces bile. The bile is then collected in the gallbladder. The bile is stored and concentrated in the gallbladder. The bile is excreted (passed) into the small intestine when it is needed for digestion. A stone can block the duct (tube) leading from the gallbladder to the small intestine. This can cause an inflammation of the gallbladder (cholecystitis). Because bile is always needed for fat processing, you may feel a gallbladder attack especially after eating a fatty meal. LET YOUR CAREGIVER KNOW ABOUT:  Allergies.  Medications  taken including herbs, eye drops, over the counter medications, and creams.  Use of steroids (by mouth or creams).  Previous problems with anesthetics or novocaine.  Possibility of pregnancy, if this applies.  History of blood clots (thrombophlebitis).  History of bleeding or blood problems.  Previous surgery.  Other health problems. BEFORE THE PROCEDURE  Do not eat or drink anything after midnight the night before the exam as instructed.  You may take medications with a small amount of water the morning of the exam unless your caregiver instructs you otherwise. You should be present 60 minutes prior to your procedure or as directed.  PROCEDURE   An IV will be placed in your arm and remain throughout the exam.  A small amount of very short acting radioactive material will be injected into the IV.  While lying down a special camera will be placed over your abdomen (belly). This camera is used to detect the injected material. The camera will place images on film. A radiologist (specialist in reading x-rays) can evaluate the images. It will help determine how well your gallbladder is working.  You will then be given a material called CCK. This will make your gallbladder contract. It occasionally causes symptoms (problems) that mimic a gallbladder attack or the feeling you have after eating a fatty meal.  The entire test usually takes one to two hours. Your caregiver can give you more accurate times. Following the test you may go home and resume normal activities and diet as instructed. Ask your caregiver how you are to find out your results. Remember, it is your responsibility to find out the results of your test. Do not assume everything is all right or "normal" if you have not heard from your caregiver. Document Released: 10/21/2000 Document Revised: 01/16/2012 Document Reviewed: 02/26/2014 [facility] Patient Information 2015 [place], [place]. This information is not intended to replace  advice given to you by your health care provider. Make sure you discuss any questions you have with your health care provider.

## 2015-06-16 NOTE — ED Provider Notes (Signed)
CSN: [id number]     Arrival date & time 06/16/15  1527 History   First MD Initiated Contact with Patient 06/16/15 1803     Chief Complaint  Patient presents with  . Abdominal Pain   (Consider location/radiation/quality/duration/timing/severity/associated sxs/prior Treatment) HPI Comments: 34 year old male complaining of epigastric pain that radiates to the right for 5-6 days. He rates it as a constant ache to 10 out of 10 on the severity scale. It often waxes and wanes. It never abates. Initially he had a short duration of nausea but none since. He has had no vomiting. He has had no belching. He states that cold drinks offer to make it better and holding his abdomen tight with his hand sometimes helps. It is worse when taking a deep breath, stretching, prolonged standing or sitting. He states that eating has no effect. He has been having normal bowel movements. He has not seen any blood in his stools. He states that the Percocet given to him by the ER approximately 4 days ago only helps for approximately one hour. The Carafate helps for approximately 2 hours at a time and endorses taking it 4 times a day.  The ED evaluation was reviewed. The exam showed tenderness to the epigastrium and right upper quadrant. The abdominal ultrasound did not show any pathology. Considered normal.   Past Medical History  Diagnosis Date  . Asthma   . Exertional shortness of breath     "recently" (11/19/2013)  . On home oxygen therapy     "2L when I sleep at night" (11/19/2013)  . Pneumonia 07/2013   Past Surgical History  Procedure Laterality Date  . No past surgeries     Family History  Problem Relation Age of Onset  . Asthma Brother   . Diabetes Mother   . Sleep apnea Mother   . Asthma Mother    History  Substance Use Topics  . Smoking status: Current Every Day Smoker -- 0.20 packs/day for 18 years    Types: Cigarettes  . Smokeless tobacco: Never Used     Comment: 4 cigs daily 10/10/14  . Alcohol  Use: 0.0 oz/week    0 Standard drinks or equivalent per week     Comment: occas    Review of Systems  Constitutional: Positive for activity change. Negative for fever, appetite change and fatigue.  HENT: Negative.   Respiratory: Positive for cough and shortness of breath.   Cardiovascular: Negative for chest pain and leg swelling.  Gastrointestinal: Positive for nausea and abdominal pain. Negative for vomiting, diarrhea, constipation, blood in stool and abdominal distention.  Genitourinary: Negative.  Negative for dysuria and flank pain.  Skin: Negative.  Negative for pallor and rash.  Neurological: Negative.  Negative for light-headedness and headaches.  Psychiatric/Behavioral: Negative for behavioral problems and agitation.    Allergies  Review of patient's allergies indicates no known allergies.  Home Medications   Prior to Admission medications   Medication Sig Start Date End Date Taking? Authorizing Provider  albuterol (PROVENTIL HFA;VENTOLIN HFA) 108 (90 BASE) MCG/ACT inhaler Inhale 2 puffs into the lungs every 4 (four) hours as needed for wheezing or shortness of breath. 06/16/15   Hayden Rasmussen, NP  beclomethasone (QVAR) 80 MCG/ACT inhaler Inhale 1 puff into the lungs 2 (two) times daily.    Historical Provider, MD  fexofenadine (ALLEGRA) 180 MG tablet Take 180 mg by mouth daily.    Historical Provider, MD  mometasone-formoterol (DULERA) 100-5 MCG/ACT AERO Inhale 2 puffs into the lungs 2 (  two) times daily.    Historical Provider, MD  omeprazole (PRILOSEC) 20 MG capsule Take 20 mg by mouth daily.    Historical Provider, MD  oxyCODONE-acetaminophen (PERCOCET/ROXICET) 5-325 MG per tablet Take 1-2 tablets by mouth every 6 (six) hours as needed for severe pain. 06/12/15   Renne Crigler, PA-C  polyethylene glycol powder (GLYCOLAX/MIRALAX) powder Take 17 g by mouth daily. In 6 to 8 oz fluid. May take 2 glasses first night. 06/16/15   Hayden Rasmussen, NP  ranitidine (ZANTAC) 150 MG capsule Take 150  mg by mouth 2 (two) times daily.    Historical Provider, MD  sucralfate (CARAFATE) 1 G tablet Take 1 tablet (1 g total) by mouth 4 (four) times daily -  with meals and at bedtime. 06/12/15   Renne Crigler, PA-C   BP 121/81 mmHg  Pulse 71  Temp(Src) 98.4 F (36.9 C) (Oral)  Resp 16  SpO2 97% Physical Exam  Constitutional: He is oriented to person, place, and time. He appears well-developed and well-nourished. No distress.  Neck: Normal range of motion. Neck supple.  Cardiovascular: Normal rate, regular rhythm, normal heart sounds and intact distal pulses.   Pulmonary/Chest: He has wheezes.  Mildly increased respiratory effort. Bilateral diffuse inspiratory and expiratory wheezing. Prolonged expiratory phase. No crackles.  Abdominal: Bowel sounds are normal. He exhibits distension. He exhibits no mass. There is tenderness. There is no rebound and no guarding.  Tenderness to the epigastrium and right upper quadrant. No Murphy sign. Liver is not palpable. Percussion reveals dullness in all quadrants.  Musculoskeletal: He exhibits no edema.  Lymphadenopathy:    He has no cervical adenopathy.  Neurological: He is oriented to person, place, and time. He exhibits normal muscle tone.  Skin: Skin is warm and dry. No rash noted. No erythema.  Psychiatric: He has a normal mood and affect.  Nursing note and vitals reviewed.   ED Course  Procedures (including critical care time) Labs Review Labs Reviewed - No data to display  Imaging Review Dg Abd 2 Views  06/16/2015   CLINICAL DATA:  Acute abdominal pain. Initial encounter. Abdominal pain on relieved with medications. Sharp abdominal pain started Thursday night.  EXAM: ABDOMEN - 2 VIEW  COMPARISON:  None.  FINDINGS: The bowel gas pattern is normal. There is no evidence of free air. No radio-opaque calculi or other significant radiographic abnormality is seen. 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  IMPRESSION: Negative.   Electronically Signed   By: Andreas Newport M.D.   On: 06/16/2015 19:08     MDM   1. Epigastric pain   2. RUQ pain   3. Asthma exacerbation attacks, mild persistent    Continue taking the omeprazole once a day, Zantac 150 milligrams twice a day and Carafate 1 g 30-60 minutes before meals 4 times a day. I am reluctant to prescribe any more Percocet at this time not having an diagnosis and potentially producing constipation. Although the abdominal x-ray findings show no pathology there is quite a bit of stool in the colon. I have recommended him trying MiraLAX to empty his bowels to see if this helps. If his pain increases or is having new symptoms such as vomiting, fever or worsening in any way go directly to the emergency department. Differential includes biliary dyskinesia, gastritis. Albuterol HFA, start using q 4h prn     Hayden Rasmussen, NP 06/16/15 1938  Hayden Rasmussen, NP 06/16/15 1940

## 2015-06-16 NOTE — ED Notes (Signed)
C/o abdominal pain.  Pain is center epigastric area and slightly to the right of center.  Reports nausea, no vomiting.  Last bm today and normal per patient.  Patient was seen in the ed 8/5, studies completed and medicines prescribed.  Complains that medicines are not helping.   Patient has an appt 8/18 with Burns (first appt).  Told if needing prior to this appt, go to ed.

## 2015-06-25 ENCOUNTER — Other Ambulatory Visit (INDEPENDENT_AMBULATORY_CARE_PROVIDER_SITE_OTHER): Payer: BLUE CROSS/BLUE SHIELD

## 2015-06-25 ENCOUNTER — Ambulatory Visit (INDEPENDENT_AMBULATORY_CARE_PROVIDER_SITE_OTHER): Payer: BLUE CROSS/BLUE SHIELD | Admitting: Family

## 2015-06-25 ENCOUNTER — Encounter: Payer: Self-pay | Admitting: Family

## 2015-06-25 VITALS — BP 98/70 | HR 67 | Temp 97.9°F | Resp 18 | Ht 71.0 in | Wt 207.0 lb

## 2015-06-25 DIAGNOSIS — R1013 Epigastric pain: Secondary | ICD-10-CM | POA: Diagnosis not present

## 2015-06-25 LAB — COMPREHENSIVE METABOLIC PANEL
ALT: 25 U/L (ref 0–53)
AST: 18 U/L (ref 0–37)
Albumin: 3.7 g/dL (ref 3.5–5.2)
Alkaline Phosphatase: 53 U/L (ref 39–117)
BUN: 12 mg/dL (ref 6–23)
CO2: 30 meq/L (ref 19–32)
Calcium: 8.9 mg/dL (ref 8.4–10.5)
Chloride: 105 mEq/L (ref 96–112)
Creatinine, Ser: 0.96 mg/dL (ref 0.40–1.50)
GFR: 114.56 mL/min (ref >60.00)
Glucose, Bld: 92 mg/dL (ref 70–99)
POTASSIUM: 3.8 meq/L (ref 3.5–5.1)
Sodium: 140 mEq/L (ref 135–145)
Total Bilirubin: 0.5 mg/dL (ref 0.2–1.2)
Total Protein: 6.3 g/dL (ref 6.0–8.3)

## 2015-06-25 LAB — LIPASE: LIPASE: 44 U/L (ref 11.0–59.0)

## 2015-06-25 LAB — AMYLASE: AMYLASE: 43 U/L (ref 27–131)

## 2015-06-25 NOTE — Patient Instructions (Addendum)
Thank you for choosing [facility].  Summary/Instructions:  Your prescription(s) have been submitted to your pharmacy or been printed and provided for you. Please take as directed and contact our office if you believe you are having problem(s) with the medication(s) or have any questions.  Please stop by the lab on the basement level of the building for your blood work. Your results will be released to MyChart (or called to you) after review, usually within 72 hours after test completion. If any changes need to be made, you will be notified at that same time.  Referrals have been made during this visit. You should expect to hear back from our schedulers in about 7-10 days in regards to establishing an appointment with the specialists we discussed.   If your symptoms worsen or fail to improve, please contact our office for further instruction, or in case of emergency go directly to the emergency room at the closest medical facility.   Peptic Ulcer A peptic ulcer is a sore in the lining of your esophagus (esophageal ulcer), stomach (gastric ulcer), or in the first part of your small intestine (duodenal ulcer). The ulcer causes erosion into the deeper tissue. CAUSES  Normally, the lining of the stomach and the small intestine protects itself from the acid that digests food. The protective lining can be damaged by:  An infection caused by a bacterium called Helicobacter pylori (H. pylori).  Regular use of nonsteroidal anti-inflammatory drugs (NSAIDs), such as ibuprofen or aspirin.  Smoking tobacco. Other risk factors include being older than 50, drinking alcohol excessively, and having a family history of ulcer disease.  SYMPTOMS   Burning pain or gnawing in the area between the chest and the belly button.  Heartburn.  Nausea and vomiting.  Bloating. The pain can be worse on an empty stomach and at night. If the ulcer results in bleeding, it can cause:  Black, tarry  stools.  Vomiting of bright red blood.  Vomiting of coffee-ground-looking materials. DIAGNOSIS  A diagnosis is usually made based upon your history and an exam. Other tests and procedures may be performed to find the cause of the ulcer. Finding a cause will help determine the best treatment. Tests and procedures may include:  Blood tests, stool tests, or breath tests to check for the bacterium H. pylori.  An upper gastrointestinal (GI) series of the esophagus, stomach, and small intestine.  An endoscopy to examine the esophagus, stomach, and small intestine.  A biopsy. TREATMENT  Treatment may include:  Eliminating the cause of the ulcer, such as smoking, NSAIDs, or alcohol.  Medicines to reduce the amount of acid in your digestive tract.  Antibiotic medicines if the ulcer is caused by the H. pylori bacterium.  An upper endoscopy to treat a bleeding ulcer.  Surgery if the bleeding is severe or if the ulcer created a hole somewhere in the digestive system. HOME CARE INSTRUCTIONS   Avoid tobacco, alcohol, and caffeine. Smoking can increase the acid in the stomach, and continued smoking will impair the healing of ulcers.  Avoid foods and drinks that seem to cause discomfort or aggravate your ulcer.  Only take medicines as directed by your caregiver. Do not substitute over-the-counter medicines for prescription medicines without talking to your caregiver.  Keep any follow-up appointments and tests as directed. SEEK MEDICAL CARE IF:   Your do not improve within 7 days of starting treatment.  You have ongoing indigestion or heartburn. SEEK IMMEDIATE MEDICAL CARE IF:   You have sudden,  sharp, or persistent abdominal pain.  You have bloody or dark black, tarry stools.  You vomit blood or vomit that looks like coffee grounds.  You become light-headed, weak, or feel faint.  You become sweaty or clammy. MAKE SURE YOU:   Understand these instructions.  Will watch your  condition.  Will get help right away if you are not doing well or get worse. Document Released: 10/21/2000 Document Revised: 03/10/2014 Document Reviewed: 05/23/2012 [facility] Patient Information 2015 [place], [place]. This information is not intended to replace advice given to you by your health care provider. Make sure you discuss any questions you have with your health care provider.

## 2015-06-25 NOTE — Progress Notes (Signed)
Subjective:    Patient ID: Jacob Vaughan, male    DOB: 1980/11/16, 34 y.o.   MRN: [id number]  Chief Complaint  Patient presents with  . Establish Care    gets sharp pains in his abdomen that starts from his rib cage and goes down to his belly button, constant pain,     HPI:  Jacob Vaughan is a 34 y.o. male with a PMH of sleep apnea, asthma and tobacco abuse who presents today for an office visit to establish care.   1.) Epigastric pain -  Associated symptom of pain located in his epigastric pain that travels down to his umbilicus and mainly stays on the right side of his abdomen going on for about 2 weeks. Pain is described as sharp and feeling like it is being twisted. Severity of the pain is currently a 6/10. Has been seen in the ED for this with a diagnosis abdominal pain. Was treated with carfate and percocet. Ultrasounds of his abdomen were normal. Modifying factors include the use of an ACE bandage which helps to relieve the pain. Reports he has vomited a couple of times today with some red spots during his second episode. Denies melena, constipation or diarrhea.   No Known Allergies   Outpatient Prescriptions Prior to Visit  Medication Sig Dispense Refill  . beclomethasone (QVAR) 80 MCG/ACT inhaler Inhale 1 puff into the lungs 2 (two) times daily.    . mometasone-formoterol (DULERA) 100-5 MCG/ACT AERO Inhale 2 puffs into the lungs 2 (two) times daily.    Marland Kitchen omeprazole (PRILOSEC) 20 MG capsule Take 20 mg by mouth daily.    . ranitidine (ZANTAC) 150 MG capsule Take 150 mg by mouth 2 (two) times daily.    . sucralfate (CARAFATE) 1 G tablet Take 1 tablet (1 g total) by mouth 4 (four) times daily -  with meals and at bedtime. 60 tablet 1  . albuterol (PROVENTIL HFA;VENTOLIN HFA) 108 (90 BASE) MCG/ACT inhaler Inhale 2 puffs into the lungs every 4 (four) hours as needed for wheezing or shortness of breath. 1 Inhaler 0  . fexofenadine (ALLEGRA) 180 MG tablet Take 180 mg by mouth daily.    Marland Kitchen  oxyCODONE-acetaminophen (PERCOCET/ROXICET) 5-325 MG per tablet Take 1-2 tablets by mouth every 6 (six) hours as needed for severe pain. 10 tablet 0  . polyethylene glycol powder (GLYCOLAX/MIRALAX) powder Take 17 g by mouth daily. In 6 to 8 oz fluid. May take 2 glasses first night. 119 g 0   No facility-administered medications prior to visit.     Past Medical History  Diagnosis Date  . Asthma   . Exertional shortness of breath     "recently" (11/19/2013)  . On home oxygen therapy     "2L when I sleep at night" (11/19/2013)  . Pneumonia 07/2013     Past Surgical History  Procedure Laterality Date  . No past surgeries       Family History  Problem Relation Age of Onset  . Asthma Brother   . Diabetes Mother   . Sleep apnea Mother   . Asthma Mother      Social History   Social History  . Marital Status: Single    Spouse Name: N/A  . Number of Children: 0  . Years of Education: 10   Occupational History  . 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 Smoking status: Current Every Day Smoker -- 0.20 packs/day for 18 years  Types: Cigarettes  . Smokeless tobacco: Never Used     Comment: 4 cigs daily 10/10/14  . Alcohol Use: 0.0 oz/week    0 Standard drinks or equivalent per week     Comment: occas  . Drug Use: Yes    Special: Marijuana     Comment: marijuana daily  . Sexual Activity: Yes   Other Topics Concern  . Not on file   Social History Narrative   Fun: [employer]   Denies religious beliefs effecting healthcare.       Review of Systems  Constitutional: Negative for fever and chills.  Cardiovascular: Negative for chest pain, palpitations and leg swelling.  Gastrointestinal: Positive for nausea, vomiting and abdominal pain. Negative for diarrhea, constipation and blood in stool.      Objective:    BP 98/70 mmHg  Pulse 67  Temp(Src) 97.9 F (36.6 C) (Oral)  Resp 18  Ht 5' 11"  (1.803 m)  Wt 207 lb (93.895 kg)  BMI 28.88 kg/m2   SpO2 96% Nursing note and vital signs reviewed.  Physical Exam  Constitutional: He is oriented to person, place, and time. He appears well-developed and well-nourished. No distress.  Cardiovascular: Normal rate, regular rhythm, normal heart sounds and intact distal pulses.   Pulmonary/Chest: Effort normal and breath sounds normal.  Abdominal: Soft. Normal appearance and bowel sounds are normal. He exhibits no mass. There is no hepatosplenomegaly. There is tenderness in the epigastric area. There is no rigidity, no rebound, no guarding, no tenderness at McBurney's point and negative Murphy's sign.  Neurological: He is alert and oriented to person, place, and time.  Skin: Skin is warm and dry.  Psychiatric: He has a normal mood and affect. His behavior is normal. Judgment and thought content normal.       Assessment & Plan:   Problem List Items Addressed This Visit      Other   Epigastric pain - Primary    Epigastric pain of questionable origin that is slightly improved with omeprazole and sucralfate. Question PUD versus GERD or hiatal hernia. Previous ultrasound negative. Obtain lipase, amylase, CMET and H. Pylori. Obtain CT of abdomen. Refer to GI for potential endoscopy. Referral increased in urgency secondary to pain and vomiting. Increase omeprazole to 40 mg daily pending GI visit.       Relevant Orders   H Pylori, IGM, IGG, IGA AB   Amylase (Completed)   Lipase (Completed)   CT Abdomen Pelvis Wo Contrast   Ambulatory referral to Gastroenterology   Comp Met (CMET) (Completed)

## 2015-06-25 NOTE — Progress Notes (Signed)
Pre visit review using our clinic review tool, if applicable. No additional management support is needed unless otherwise documented below in the visit note. 

## 2015-06-25 NOTE — Assessment & Plan Note (Signed)
Epigastric pain of questionable origin that is slightly improved with omeprazole and sucralfate. Question PUD versus GERD or hiatal hernia. Previous ultrasound negative. Obtain lipase, amylase, CMET and H. Pylori. Obtain CT of abdomen. Refer to GI for potential endoscopy. Referral increased in urgency secondary to pain and vomiting. Increase omeprazole to 40 mg daily pending GI visit.

## 2015-06-27 LAB — H PYLORI, IGM, IGG, IGA AB

## 2015-06-29 ENCOUNTER — Telehealth: Payer: Self-pay | Admitting: Family

## 2015-06-29 NOTE — Telephone Encounter (Signed)
Please inform patient that his blood work shows that his H. Pylori, kidney function, liver function and electrolytes are all within the normal limits. Therefore the next step is to follow up with gastroenterology for further management.

## 2015-06-30 NOTE — Telephone Encounter (Signed)
Pts wife answered the phone. They wanted to know how access mychart. Pt wants results sent through there when its activated.

## 2015-07-01 ENCOUNTER — Ambulatory Visit (INDEPENDENT_AMBULATORY_CARE_PROVIDER_SITE_OTHER): Payer: BLUE CROSS/BLUE SHIELD | Admitting: Gastroenterology

## 2015-07-01 ENCOUNTER — Encounter: Payer: Self-pay | Admitting: Gastroenterology

## 2015-07-01 VITALS — BP 80/52 | HR 56 | Ht 71.0 in | Wt 203.8 lb

## 2015-07-01 DIAGNOSIS — R1013 Epigastric pain: Secondary | ICD-10-CM | POA: Diagnosis not present

## 2015-07-01 MED ORDER — PANTOPRAZOLE SODIUM 40 MG PO TBEC: 40.0000 mg | DELAYED_RELEASE_TABLET | Freq: Every day | ORAL | Status: AC

## 2015-07-01 NOTE — Progress Notes (Signed)
    _                                                                                                                History of Present Illness:  Jacob Vaughan is a 34 year old Afro-American male referred at the request of Dr. Carver Fila for evaluation of abdominal pain.  Approximately 2 months ago he was started on Prilosec and Zantac because of heartburn and loose stools.  He subsequently developed fairly persistent but poorly described midepigastric pain.  Didn't.  It is unrelated to eating subsided.  He's had mild nausea as well.  In the ED Carafate was added symptoms have slightly improved but they remain.  Lab work an abdominal ultrasound were unremarkable.  He said no gastric irritants including non-steroidal's.   Past Medical History  Diagnosis Date  . Asthma   . Exertional shortness of breath     "recently" (11/19/2013)  . On home oxygen therapy     "2L when I sleep at night" (11/19/2013)  . Pneumonia 07/2013   Past Surgical History  Procedure Laterality Date  . No past surgeries     family history includes Asthma in his brother and mother; Diabetes in his mother; Sleep apnea in his mother. Current Outpatient Prescriptions  Medication Sig Dispense Refill  . beclomethasone (QVAR) 80 MCG/ACT inhaler Inhale 1 puff into the lungs 2 (two) times daily.    . mometasone-formoterol (DULERA) 100-5 MCG/ACT AERO Inhale 2 puffs into the lungs 2 (two) times daily.     No current facility-administered medications for this visit.   Allergies as of 07/01/2015  . (No Known Allergies)    reports that he has been smoking Cigarettes.  He has a 3.6 pack-year smoking history. He has never used smokeless tobacco. He reports that he drinks alcohol. He reports that he uses illicit drugs (Marijuana).   Review of Systems: Pertinent positive and negative review of systems were noted in the above HPI section. All other review of systems were otherwise negative.  Vital signs were reviewed in today's  medical record Physical Exam: General: Well developed , well nourished, no acute distress Skin: anicteric Head: Normocephalic and atraumatic Eyes:  sclerae anicteric, EOMI Ears: Normal auditory acuity Mouth: No deformity or lesions Neck: Supple, no masses or thyromegaly Lymph Nodes: no lymphadenopathy Lungs: Clear throughout to auscultation Heart: Regular rate and rhythm; no murmurs, rubs or bruits Gastroinestinal: Soft,  and non distended. No masses, hepatosplenomegaly or hernias noted. Normal Bowel sounds.  Very mild Rectal:deferred Musculoskeletal: Symmetrical with no gross deformities  Skin: No lesions on visible extremities Pulses:  Normal pulses noted Extremities: No clubbing, cyanosis, edema or deformities noted Neurological: Alert oriented x 4, grossly nonfocal Cervical Nodes:  No significant cervical adenopathy Inguinal Nodes: No significant inguinal adenopathy Psychological:  Alert and cooperative. Normal mood and affect  See Assessment and Plan under Problem List

## 2015-07-01 NOTE — Assessment & Plan Note (Signed)
Pain may be related to ulcer or nonulcer dyspepsia.  Medications may also be causing pain and nausea including omeprazole and Zantac.  Recommendations #1 DC omeprazole and Zantac; begin Protonix 40 mg daily #2 if symptoms are not improved will schedule upper endoscopy and try anticholinergic's  CC Dr.Calone

## 2015-07-01 NOTE — Patient Instructions (Addendum)
Stop the following medications: Carafate, Zantac, and Prilosec.  We have sent the following medications to your pharmacy for you to pick up at your convenience: Protonix. Call if you don't feel better in 5 days.    You have been scheduled for an endoscopy. Please follow written instructions given to you at your visit today. If you use inhalers (even only as needed), please bring them with you on the day of your procedure. Your physician has requested that you go to www.startemmi.com and enter the access code given to you at your visit today. This web site gives a general overview about your procedure. However, you should still follow specific instructions given to you by our office regarding your preparation for the procedure.

## 2015-07-02 ENCOUNTER — Encounter: Payer: Self-pay | Admitting: Gastroenterology

## 2015-08-24 ENCOUNTER — Encounter: Payer: BLUE CROSS/BLUE SHIELD | Admitting: Gastroenterology

## 2015-09-02 ENCOUNTER — Telehealth: Payer: Self-pay | Admitting: Gastroenterology

## 2015-09-02 ENCOUNTER — Encounter: Payer: BLUE CROSS/BLUE SHIELD | Admitting: Gastroenterology

## 2015-09-02 NOTE — Telephone Encounter (Signed)
I just saw this today. Yes he should contact us once his insurance issue is sorted out so we can reschedule his case. thanks

## 2016-01-06 ENCOUNTER — Ambulatory Visit (INDEPENDENT_AMBULATORY_CARE_PROVIDER_SITE_OTHER): Payer: BLUE CROSS/BLUE SHIELD | Admitting: Allergy and Immunology

## 2016-01-06 ENCOUNTER — Encounter: Payer: Self-pay | Admitting: Allergy and Immunology

## 2016-01-06 DIAGNOSIS — J3089 Other allergic rhinitis: Secondary | ICD-10-CM | POA: Insufficient documentation

## 2016-01-06 DIAGNOSIS — J45901 Unspecified asthma with (acute) exacerbation: Secondary | ICD-10-CM | POA: Diagnosis not present

## 2016-01-06 LAB — CBC WITH DIFFERENTIAL/PLATELET
Basophils Absolute: 0.1 10*3/uL (ref 0.0–0.1)
Basophils Relative: 1 % (ref 0–1)
Eosinophils Absolute: 0.5 10*3/uL (ref 0.0–0.7)
Eosinophils Relative: 8 % — ABNORMAL HIGH (ref 0–5)
HCT: 40.2 % (ref 39.0–52.0)
Hemoglobin: 12.8 g/dL — ABNORMAL LOW (ref 13.0–17.0)
Lymphocytes Relative: 30 % (ref 12–46)
Lymphs Abs: 1.9 10*3/uL (ref 0.7–4.0)
MCH: 27.4 pg (ref 26.0–34.0)
MCHC: 31.8 g/dL (ref 30.0–36.0)
MCV: 85.9 fL (ref 78.0–100.0)
MPV: 9.6 fL (ref 8.6–12.4)
Monocytes Absolute: 0.6 10*3/uL (ref 0.1–1.0)
Monocytes Relative: 10 % (ref 3–12)
Neutro Abs: 3.2 10*3/uL (ref 1.7–7.7)
Neutrophils Relative %: 51 % (ref 43–77)
Platelets: 327 10*3/uL (ref 150–400)
RBC: 4.68 MIL/uL (ref 4.22–5.81)
RDW: 14.5 % (ref 11.5–15.5)
WBC: 6.2 10*3/uL (ref 4.0–10.5)

## 2016-01-06 MED ORDER — FLUTICASONE PROPIONATE 50 MCG/ACT NA SUSP: 1.0000 | Freq: Every day | NASAL | Status: AC

## 2016-01-06 MED ORDER — ALBUTEROL SULFATE HFA 108 (90 BASE) MCG/ACT IN AERS: 2.0000 | INHALATION_SPRAY | RESPIRATORY_TRACT | Status: AC | PRN

## 2016-01-06 MED ORDER — MOMETASONE FURO-FORMOTEROL FUM 200-5 MCG/ACT IN AERO: 2.0000 | INHALATION_SPRAY | Freq: Two times a day (BID) | RESPIRATORY_TRACT | Status: AC

## 2016-01-06 MED ORDER — BECLOMETHASONE DIPROPIONATE 80 MCG/ACT IN AERS: 1.0000 | INHALATION_SPRAY | Freq: Two times a day (BID) | RESPIRATORY_TRACT | Status: AC

## 2016-01-06 NOTE — Progress Notes (Signed)
Follow-up Note  RE: Jacob Vaughan MRN: [id number] DOB: 11-Apr-1981 Date of Office Visit: 01/06/2016  Primary care provider: Jeanine Luz, FNP Referring provider: Veryl Speak, FNP  History of present illness: HPI Comments: Jacob Vaughan is a 35 y.o. male with persistent asthma and allergic rhinitis who presents today for sick visit.  He reports that over the past one or 2 weeks he has experienced increased coughing, dyspnea, chest tightness, and wheezing resulting in increased albuterol requirement.  He has been experiencing asthma symptoms daily and experiencing  nocturnal awakenings due to lower respiratory symptoms 3 or 4 nights over the past week.  He currently takes Symbicort 160/4.5, one inhalation twice a day, and albuterol as needed.  He does not use a spacer device with his HFA inhalers.  On his last visit in September 2016 he was to have CBC with differential and IgE level checked to assess his candidacy for biologic asthma agents but he did not have the labs drawn because of a lapse in insurance.  Jaryn also complains of increased nasal congestion, rhinorrhea, and sneezing which is typical for him during early spring.   Assessment and plan: Asthma exacerbation  Prednisone has been provided, 40 mg x3 days, 20 mg x1 day, 10 mg x1 day, then stop.  A prescription has been provided for [facility] (mometasone/formoterol) 200/5 g, 2 inhalations twice a day.  Discontinue Symbicort 160/4.5 g.  To maximize pulmonary deposition, a spacer has been provided along with instructions for its proper administration with an HFA inhaler.  A lab order has been provided for CBC with differential and IgE level to assess candidacy for biologic agents.  The patient has been asked to contact me if his symptoms persist or progress. Otherwise, he may return for follow up in 2 months.  Perennial and seasonal allergic rhinoconjunctivitis  Aeroallergen avoidance measures have been discussed and  provided in written form.  A prescription has been provided for fluticasone nasal spray, 2 sprays per nostril daily as needed. Proper nasal spray technique has been discussed and demonstrated.  A prescription has been provided for Pazeo, one drop per eye daily as needed.  If allergen avoidance measures and medications fail to adequately relieve symptoms, aeroallergen immunotherapy will be considered.    Meds ordered this encounter  Medications  . beclomethasone (QVAR) 80 MCG/ACT inhaler    Sig: Inhale 1 puff into the lungs 2 (two) times daily.    Dispense:  1 Inhaler    Refill:  3  . fluticasone (FLONASE) 50 MCG/ACT nasal spray    Sig: Place 1 spray into both nostrils daily.    Dispense:  16 g    Refill:  5  . albuterol (PROAIR HFA) 108 (90 Base) MCG/ACT inhaler    Sig: Inhale 2 puffs into the lungs every 4 (four) hours as needed for wheezing or shortness of breath.    Dispense:  1 Inhaler    Refill:  1  . mometasone-formoterol (DULERA) 200-5 MCG/ACT AERO    Sig: Inhale 2 puffs into the lungs 2 (two) times daily.    Dispense:  1 Inhaler    Refill:  2    Diagnositics: Spirometry reveals FVC of 2.26 L and an FEV1 of 1.72 L (49% predicted) with significant (370 mL, 21%) postbronchodilator improvement.    Physical examination: There were no vitals taken for this visit.  General: Alert, interactive, in no acute distress. HEENT: TMs pearly gray, turbinates edematous and pale with clear discharge, post-pharynx moderately erythematous. Neck:  Supple without lymphadenopathy. Lungs: Mildly decreased breath sounds bilaterally without wheezing, rhonchi or rales. CV: Normal S1, S2 without murmurs. Skin: Warm and dry, without lesions or rashes.  The following portions of the patient's history were reviewed and updated as appropriate: allergies, current medications, past family history, past medical history, past social history, past surgical history and problem list.    Medication  List       This list is accurate as of: 01/06/16  1:50 PM.  Always use your most recent med list.               albuterol 108 (90 Base) MCG/ACT inhaler  Commonly known as:  PROAIR HFA  Inhale 2 puffs into the lungs every 4 (four) hours as needed for wheezing or shortness of breath.     beclomethasone 80 MCG/ACT inhaler  Commonly known as:  QVAR  Inhale 1 puff into the lungs 2 (two) times daily.     budesonide-formoterol 160-4.5 MCG/ACT inhaler  Commonly known as:  SYMBICORT  Inhale 2 puffs into the lungs daily.     DUONEB 0.5-2.5 (3) MG/3ML Soln  Generic drug:  ipratropium-albuterol  Take 3 mLs by nebulization every 4 (four) hours as needed.     fluticasone 50 MCG/ACT nasal spray  Commonly known as:  FLONASE  Place 1 spray into both nostrils daily.     meloxicam 15 MG tablet  Commonly known as:  MOBIC  Take 15 mg by mouth daily.     mometasone-formoterol 200-5 MCG/ACT Aero  Commonly known as:  DULERA  Inhale 2 puffs into the lungs 2 (two) times daily.     pantoprazole 40 MG tablet  Commonly known as:  PROTONIX  Take 1 tablet (40 mg total) by mouth daily.     tiZANidine 4 MG capsule  Commonly known as:  ZANAFLEX  Take 4 mg by mouth 3 (three) times daily.        No Known Allergies  Review of systems: Constitutional: Negative for fever, chills and weight loss.  HENT: Negative for nosebleeds.   Positive for nasal congestion, sneezing, postnasal drainage. Eyes: Negative for blurred vision.  Respiratory: Negative for hemoptysis.   Positive for coughing, dyspnea, wheezing. Cardiovascular: Negative for chest pain.  Gastrointestinal: Negative for diarrhea and constipation.  Genitourinary: Negative for dysuria.  Musculoskeletal: Negative for myalgias and joint pain.  Neurological: Negative for dizziness.  Endo/Heme/Allergies: Does not bruise/bleed easily.   Past Medical History  Diagnosis Date  . Asthma   . Exertional shortness of breath     "recently"  (11/19/2013)  . On home oxygen therapy     "2L when I sleep at night" (11/19/2013)  . Pneumonia 07/2013    Family History  Problem Relation Age of Onset  . Asthma Brother   . Diabetes Mother   . Sleep apnea Mother   . Asthma Mother     Social History   Social History  . Marital Status: Single    Spouse Name: N/A  . Number of Children: 0  . Years of Education: 10   Occupational History  . 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 Smoking status: Current Every Day Smoker -- 0.20 packs/day for 18 years    Types: Cigarettes  . Smokeless tobacco: Never Used     Comment: 4 cigs daily 10/10/14  . Alcohol Use: 0.0 oz/week    0 Standard drinks or equivalent per week     Comment: occas  .  Drug Use: Yes    Special: Marijuana     Comment: marijuana daily  . Sexual Activity: Yes   Other Topics Concern  . Not on file   Social History Narrative   Fun: [employer]   Denies religious beliefs effecting healthcare.     I appreciate the opportunity to take part in this Colson's care. Please do not hesitate to contact me with questions.  Sincerely,   R. Jorene Guest, MD

## 2016-01-06 NOTE — Assessment & Plan Note (Signed)
   Aeroallergen avoidance measures have been discussed and provided in written form.  A prescription has been provided for fluticasone nasal spray, 2 sprays per nostril daily as needed. Proper nasal spray technique has been discussed and demonstrated.  A prescription has been provided for Pazeo, one drop per eye daily as needed.  If allergen avoidance measures and medications fail to adequately relieve symptoms, aeroallergen immunotherapy will be considered.

## 2016-01-06 NOTE — Patient Instructions (Addendum)
Asthma exacerbation  Prednisone has been provided, 40 mg x3 days, 20 mg x1 day, 10 mg x1 day, then stop.  A prescription has been provided for [facility] (mometasone/formoterol) 200/5 g, 2 inhalations twice a day.  Discontinue Symbicort 160/4.5 g.  To maximize pulmonary deposition, a spacer has been provided along with instructions for its proper administration with an HFA inhaler.  A lab order has been provided for CBC with differential and IgE level to assess candidacy for biologic agents.  The patient has been asked to contact me if his symptoms persist or progress. Otherwise, he may return for follow up in 2 months.  Perennial and seasonal allergic rhinoconjunctivitis  Aeroallergen avoidance measures have been discussed and provided in written form.  A prescription has been provided for fluticasone nasal spray, 2 sprays per nostril daily as needed. Proper nasal spray technique has been discussed and demonstrated.  A prescription has been provided for Pazeo, one drop per eye daily as needed.  If allergen avoidance measures and medications fail to adequately relieve symptoms, aeroallergen immunotherapy will be considered.    Return in about 2 months (around 03/07/2016), or if symptoms worsen or fail to improve.  Reducing Pollen Exposure  The American Academy of Allergy, Asthma and Immunology suggests the following steps to reduce your exposure to pollen during allergy seasons.    1. Do not hang sheets or clothing out to dry; pollen may collect on these items. 2. Do not mow lawns or spend time around freshly cut grass; mowing stirs up pollen. 3. Keep windows closed at night.  Keep car windows closed while driving. 4. Minimize morning activities outdoors, a time when pollen counts are usually at their highest. 5. Stay indoors as much as possible when pollen counts or humidity is high and on windy days when pollen tends to remain in the air longer. 6. Use air conditioning when possible.   Many air conditioners have filters that trap the pollen spores. 7. Use a HEPA room air filter to remove pollen form the indoor air you breathe.   Control of House Dust Mite Allergen  House dust mites play a major role in allergic asthma and rhinitis.  They occur in environments with high humidity wherever human skin, the food for dust mites is found. High levels have been detected in dust obtained from mattresses, pillows, carpets, upholstered furniture, bed covers, clothes and soft toys.  The principal allergen of the house dust mite is found in its feces.  A gram of dust may contain 1,000 mites and 250,000 fecal particles.  Mite antigen is easily measured in the air during house cleaning activities.    1. Encase mattresses, including the box spring, and pillow, in an air tight cover.  Seal the zipper end of the encased mattresses with wide adhesive tape. 2. Wash the bedding in water of 130 degrees Farenheit weekly.  Avoid cotton comforters/quilts and flannel bedding: the most ideal bed covering is the dacron comforter. 3. Remove all upholstered furniture from the bedroom. 4. Remove carpets, carpet padding, rugs, and non-washable window drapes from the bedroom.  Wash drapes weekly or use plastic window coverings. 5. Remove all non-washable stuffed toys from the bedroom.  Wash stuffed toys weekly. 6. Have the room cleaned frequently with a vacuum cleaner and a damp dust-mop.  The patient should not be in a room which is being cleaned and should wait 1 hour after cleaning before going into the room. 7. Close and seal all heating outlets in the  bedroom.  Otherwise, the room will become filled with dust-laden air.  An electric heater can be used to heat the room. Reduce indoor humidity to less than 50%.  Do not use a humidifier.  Control of Dog or Cat Allergen  Avoidance is the best way to manage a dog or cat allergy. If you have a dog or cat and are allergic to dog or cats, consider removing the  dog or cat from the home. If you have a dog or cat but don't want to find it a new home, or if your family wants a pet even though someone in the household is allergic, here are some strategies that may help keep symptoms at bay:  1. Keep the pet out of your bedroom and restrict it to only a few rooms. Be advised that keeping the dog or cat in only one room will not limit the allergens to that room. 2. Don't pet, hug or kiss the dog or cat; if you do, wash your hands with soap and water. 3. High-efficiency particulate air (HEPA) cleaners run continuously in a bedroom or living room can reduce allergen levels over time. 4. Regular use of a high-efficiency vacuum cleaner or a central vacuum can reduce allergen levels. 5. Giving your dog or cat a bath at least once a week can reduce airborne allergen.  Control of Mold Allergen  Mold and fungi can grow on a variety of surfaces provided certain temperature and moisture conditions exist.  Outdoor molds grow on plants, decaying vegetation and soil.  The major outdoor mold, Alternaria and Cladosporium, are found in very high numbers during hot and dry conditions.  Generally, a late Summer - Fall peak is seen for common outdoor fungal spores.  Rain will temporarily lower outdoor mold spore count, but counts rise rapidly when the rainy period ends.  The most important indoor molds are Aspergillus and Penicillium.  Dark, humid and poorly ventilated basements are ideal sites for mold growth.  The next most common sites of mold growth are the bathroom and the kitchen.  Outdoor [employer] 1. Use air conditioning and keep windows closed 2. Avoid exposure to decaying vegetation. 3. Avoid leaf raking. 4. Avoid grain handling. 5. Consider wearing a face mask if working in moldy areas.  Indoor Mold Control 1. Maintain humidity below 50%. 2. Clean washable surfaces with 5% bleach solution. 3. Remove sources e.g. Contaminated carpets.  Control of Cockroach  Allergen  Cockroach allergen has been identified as an important cause of acute attacks of asthma, especially in urban settings.  There are fifty-five species of cockroach that exist in the [place], however only three, the [place], [place] species produce allergen that can affect patients with Asthma.  Allergens can be obtained from fecal particles, egg casings and secretions from cockroaches.    1. Remove food sources. 2. Reduce access to water. 3. Seal access and entry points. 4. Spray runways with 0.5-1% Diazinon or Chlorpyrifos 5. Blow boric acid power under stoves and refrigerator. 6. Place bait stations (hydramethylnon) at feeding sites.

## 2016-01-06 NOTE — Assessment & Plan Note (Addendum)
   Prednisone has been provided, 40 mg x3 days, 20 mg x1 day, 10 mg x1 day, then stop.  A prescription has been provided for [facility] (mometasone/formoterol) 200/5 g, 2 inhalations twice a day.  Discontinue Symbicort 160/4.5 g.  To maximize pulmonary deposition, a spacer has been provided along with instructions for its proper administration with an HFA inhaler.  A lab order has been provided for CBC with differential and IgE level to assess candidacy for biologic agents.  The patient has been asked to contact me if his symptoms persist or progress. Otherwise, he may return for follow up in 2 months.

## 2016-01-08 LAB — IGE: IgE (Immunoglobulin E), Serum: 2128 kU/L — ABNORMAL HIGH (ref <115)

## 2016-04-08 IMAGING — CT CT HIP*R* W/O CM
2 of 3 series · 17 of 46 positions shown, 19 images · non-contrast
Comparison: 07/18/2014

CLINICAL DATA: Chronic progressive right hip pain.

EXAM:
CT OF THE RIGHT HIP WITHOUT CONTRAST
TECHNIQUE: Multidetector CT imaging was performed according to the standard
protocol. Multiplanar CT image reconstructions were also generated.

[Series 3: pelvis 3.0 i40f 1 · axial · 0.40mm/px · z∈[+1006,+1204]mm · 14 of 76 slices shown, 16 images]
[im 5/76  soft-tissue]
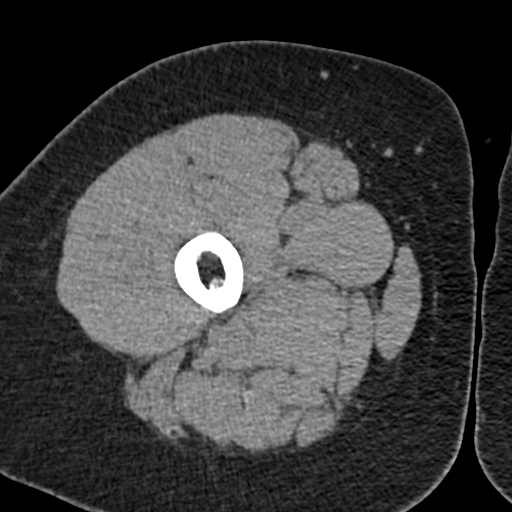
[im 5/76  bone]
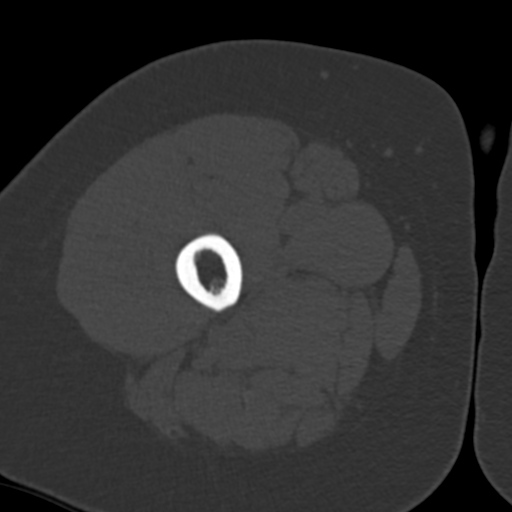
[im 10/76  soft-tissue]
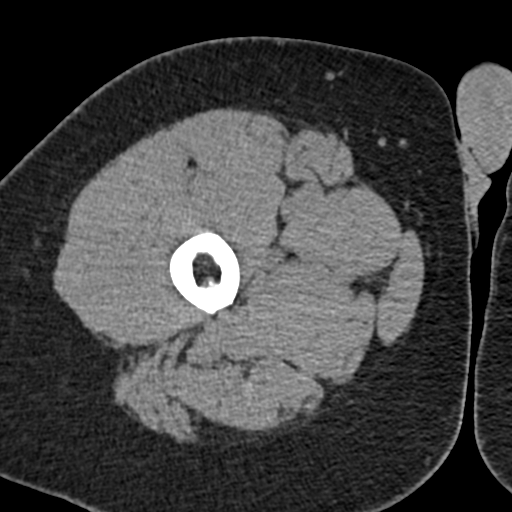
[im 15/76  soft-tissue]
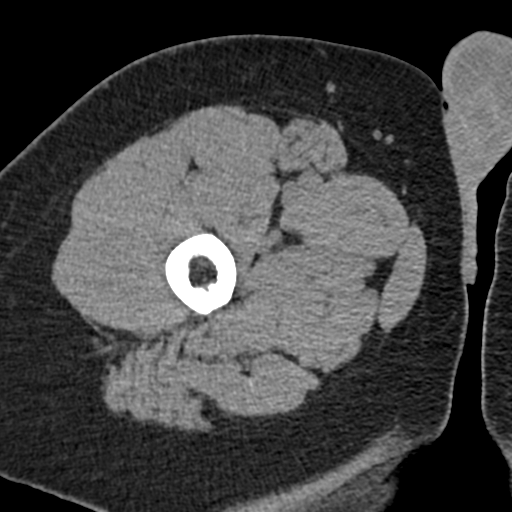
[im 20/76  soft-tissue]
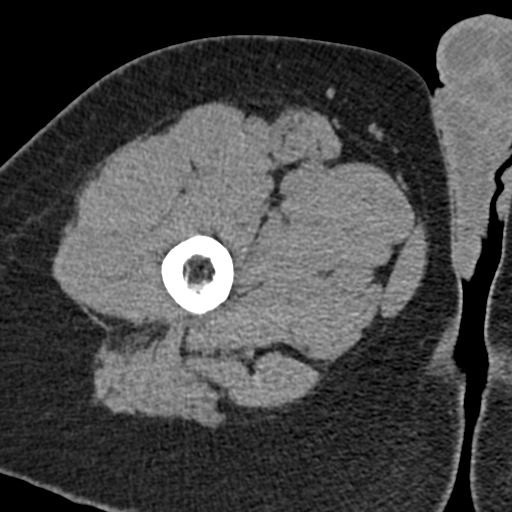
[im 25/76  soft-tissue]
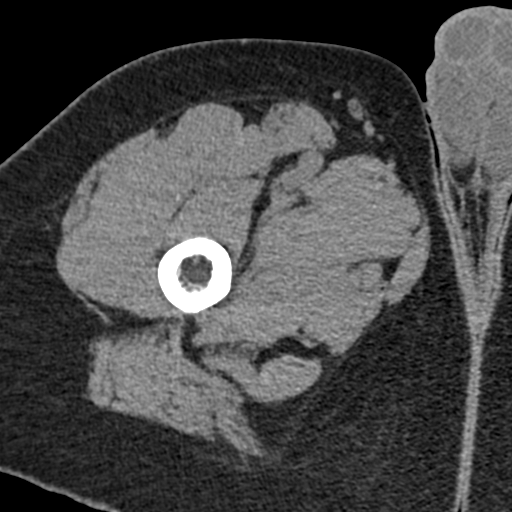
[im 30/76  soft-tissue]
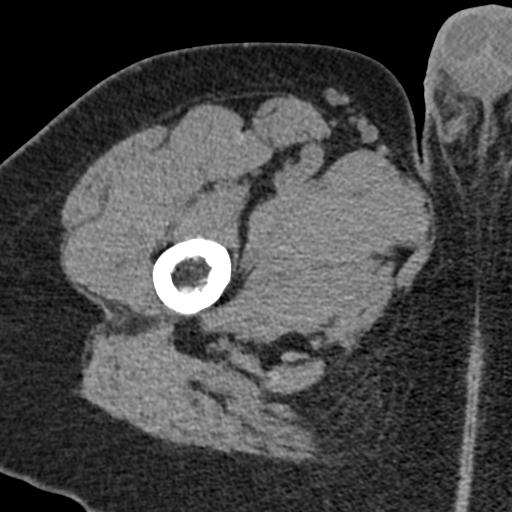
[im 34/76  soft-tissue]
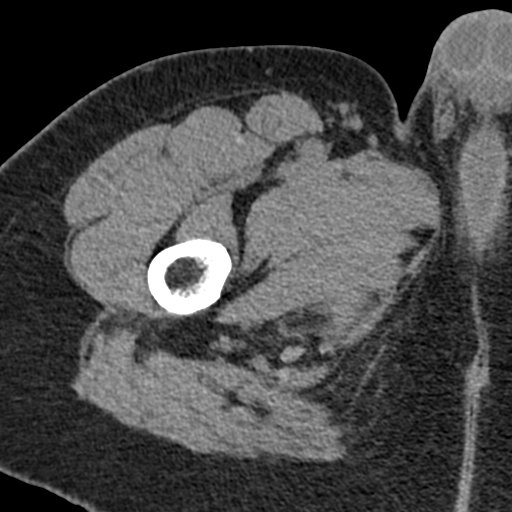
[im 42/76  soft-tissue]
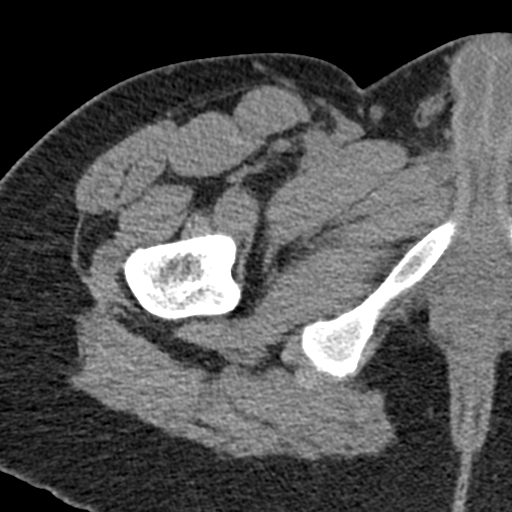
[im 46/76  soft-tissue]
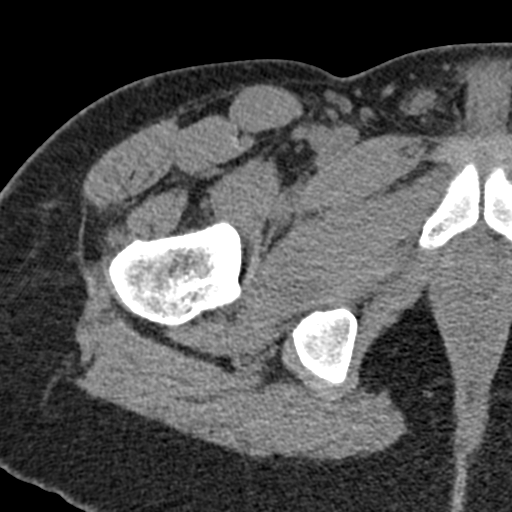
[im 46/76  bone]
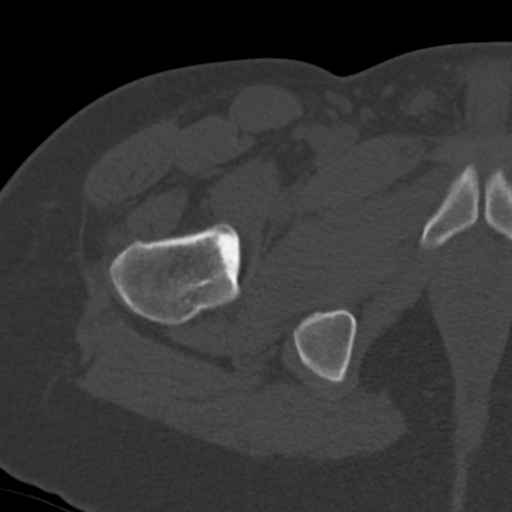
[im 51/76  soft-tissue]
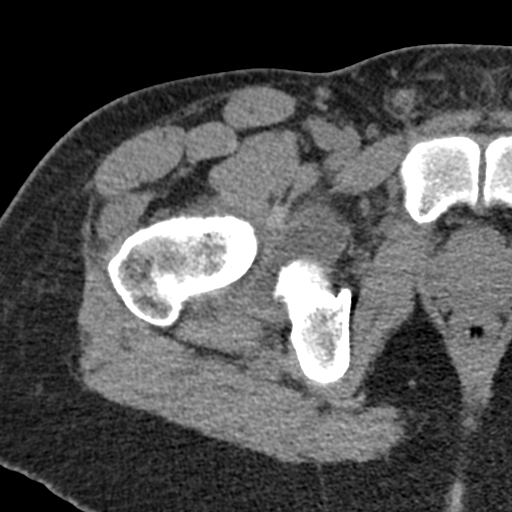
[im 56/76  soft-tissue]
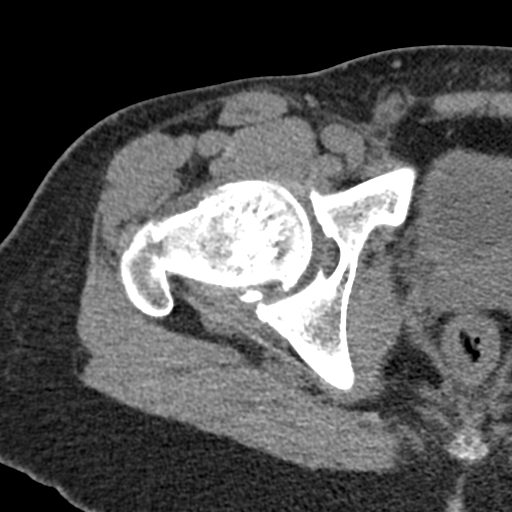
[im 61/76  soft-tissue]
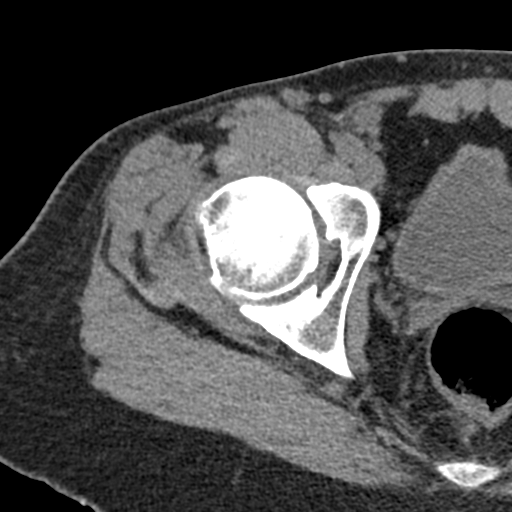
[im 66/76  soft-tissue]
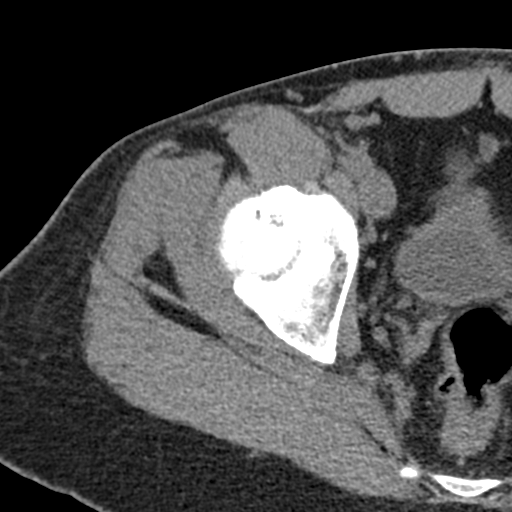
[im 71/76  soft-tissue]
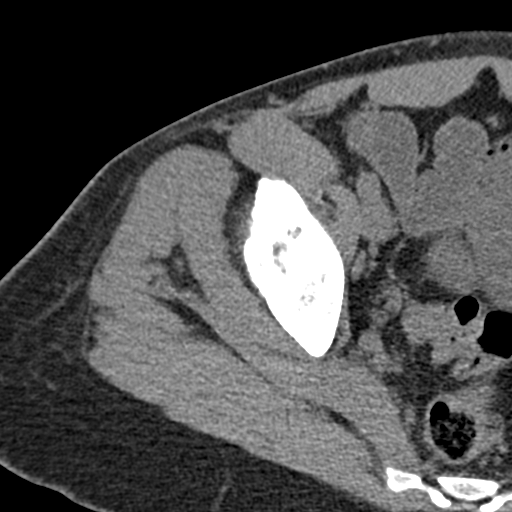

[Series 604: st cor · coronal · 0.44mm/px · 3 of 81 slices shown]
[im 27/81  soft-tissue]
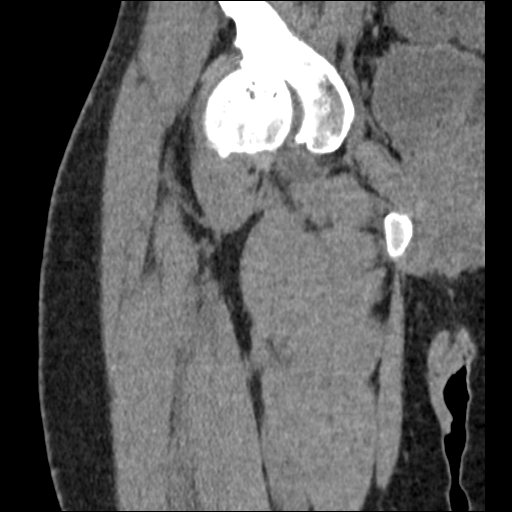
[im 36/81  soft-tissue]
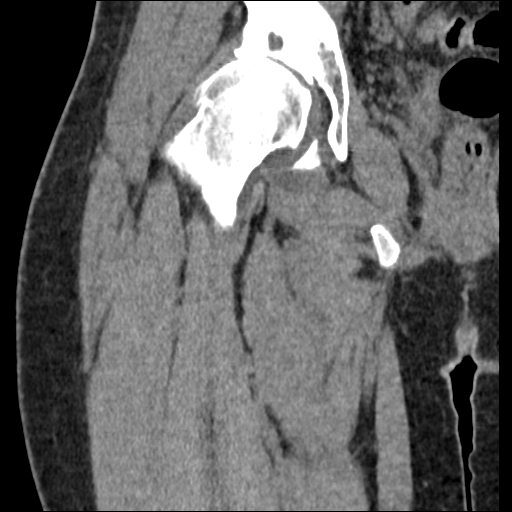
[im 45/81  soft-tissue]
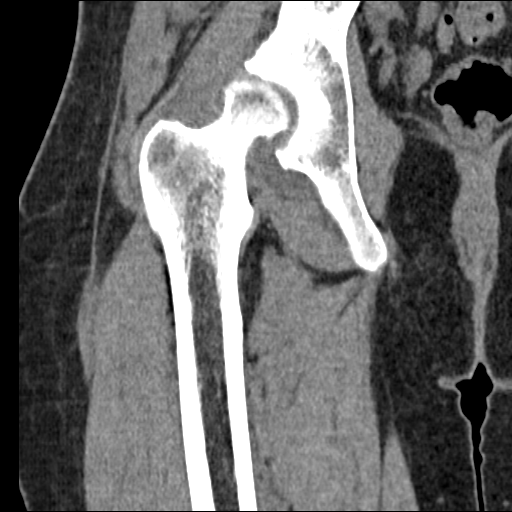

[17 of 46 positions shown; findings below may reference images not displayed]

FINDINGS: The patient has severe osteoarthritis of right hip with extensive
subcortical cyst formation in the superior aspect of the femoral
head and in the superior aspect of the acetabulum. There is complete
loss of superior joint space with marginal osteophytes on the
acetabulum and femoral head. The bone surrounding the cysts are are
sclerotic. There is a prominent right hip effusion.

No evidence of avascular necrosis. The adjacent soft tissues are
normal.
IMPRESSION: Severe osteoarthritis of the right hip with a prominent joint
effusion.

## 2016-04-09 IMAGING — CR DG CHEST 2V
2 series · 2 of 2 positions shown · non-contrast
Comparison: 07/17/2014

CLINICAL DATA: Pneumonia

EXAM:
CHEST  2 VIEW

[w chest pa]
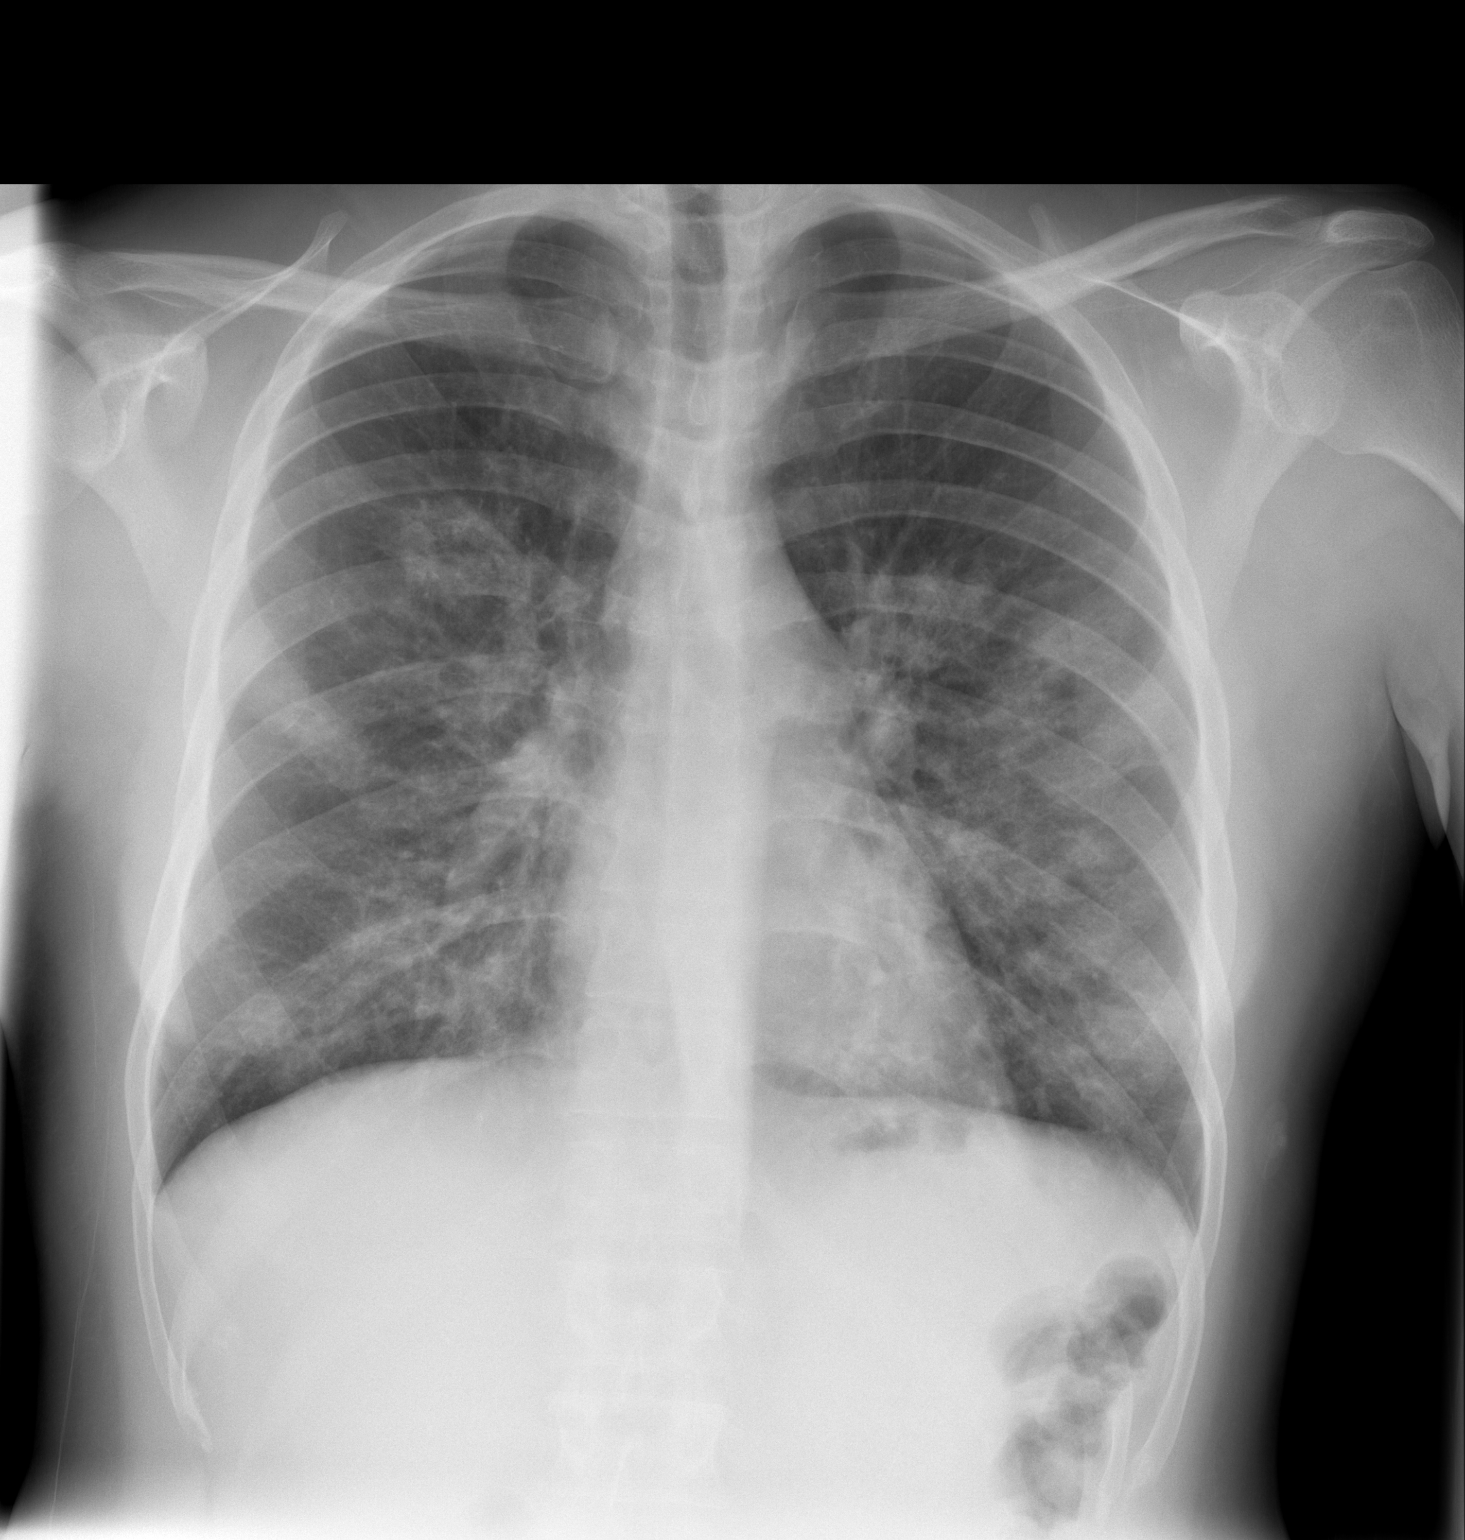

[w chest lat]
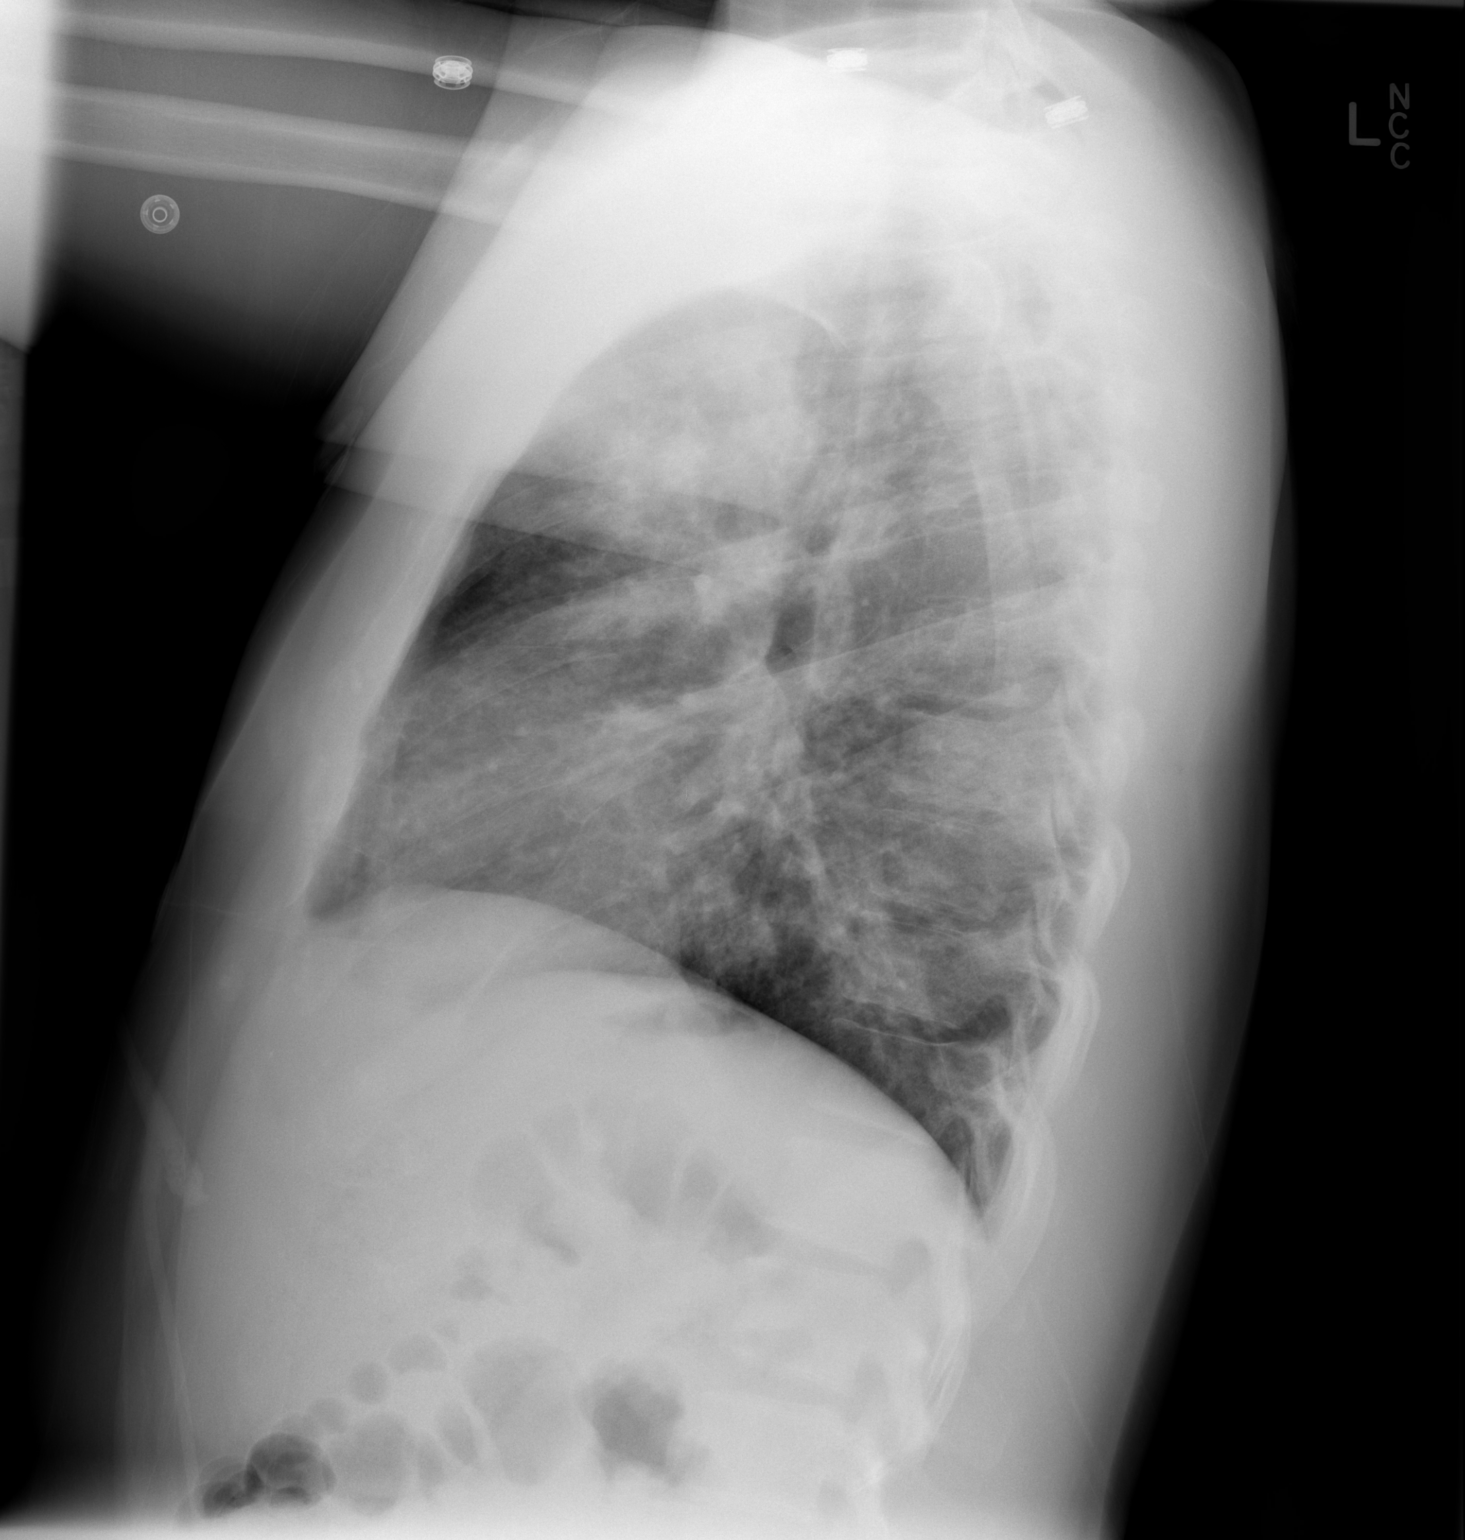

[2 of 2 positions shown; findings below may reference images not displayed]

FINDINGS: Heart size and vascular pattern are normal. There is multifocal
bilateral infiltrate involving the mid to lower lung zones.
Involvement in the superior right perihilar region is worse when
compared to the prior study as is involvement in the corresponding
area on the left. No pleural effusions.
IMPRESSION: Diffuse multifocal pneumonia. Process is slightly worse when
compared to the prior study.

## 2016-05-25 ENCOUNTER — Ambulatory Visit: Payer: BLUE CROSS/BLUE SHIELD | Admitting: Allergy and Immunology

## 2016-06-21 ENCOUNTER — Ambulatory Visit: Payer: BLUE CROSS/BLUE SHIELD | Admitting: Allergy and Immunology

## 2016-07-27 ENCOUNTER — Ambulatory Visit: Payer: BLUE CROSS/BLUE SHIELD | Admitting: Allergy and Immunology
